# Patient Record
Sex: Female | Born: 1965 | Race: White | Hispanic: No | Marital: Married | Smoking: Never smoker
Health system: Southern US, Community
[De-identification: ages and names within clinical notes are randomized; demographics above are authoritative.]

## PROBLEM LIST (undated history)

## (undated) DIAGNOSIS — K219 Gastro-esophageal reflux disease without esophagitis: Secondary | ICD-10-CM

## (undated) HISTORY — DX: Gastro-esophageal reflux disease without esophagitis: K21.9

---

## 1993-07-28 HISTORY — PX: LAPAROSCOPIC CHOLECYSTECTOMY: SUR755

## 1998-04-09 ENCOUNTER — Other Ambulatory Visit: Admission: RE | Admit: 1998-04-09 | Discharge: 1998-04-09 | Payer: Self-pay | Admitting: Obstetrics and Gynecology

## 1999-06-06 ENCOUNTER — Other Ambulatory Visit: Admission: RE | Admit: 1999-06-06 | Discharge: 1999-06-06 | Payer: Self-pay | Admitting: Obstetrics and Gynecology

## 2000-06-26 ENCOUNTER — Other Ambulatory Visit: Admission: RE | Admit: 2000-06-26 | Discharge: 2000-06-26 | Payer: Self-pay | Admitting: Obstetrics and Gynecology

## 2001-07-26 ENCOUNTER — Other Ambulatory Visit: Admission: RE | Admit: 2001-07-26 | Discharge: 2001-07-26 | Payer: Self-pay | Admitting: Obstetrics and Gynecology

## 2002-11-17 ENCOUNTER — Other Ambulatory Visit: Admission: RE | Admit: 2002-11-17 | Discharge: 2002-11-17 | Payer: Self-pay | Admitting: Obstetrics and Gynecology

## 2003-12-26 ENCOUNTER — Other Ambulatory Visit: Admission: RE | Admit: 2003-12-26 | Discharge: 2003-12-26 | Payer: Self-pay | Admitting: Obstetrics and Gynecology

## 2005-03-14 ENCOUNTER — Other Ambulatory Visit: Admission: RE | Admit: 2005-03-14 | Discharge: 2005-03-14 | Payer: Self-pay | Admitting: Obstetrics and Gynecology

## 2007-05-14 ENCOUNTER — Encounter (INDEPENDENT_AMBULATORY_CARE_PROVIDER_SITE_OTHER): Payer: Self-pay | Admitting: Obstetrics and Gynecology

## 2007-05-14 ENCOUNTER — Ambulatory Visit (HOSPITAL_COMMUNITY): Admission: RE | Admit: 2007-05-14 | Discharge: 2007-05-14 | Payer: Self-pay | Admitting: Obstetrics and Gynecology

## 2008-07-28 HISTORY — PX: LASER ABLATION: SHX1947

## 2010-12-10 NOTE — Op Note (Signed)
NAMENATAJAH, DERDERIAN               ACCOUNT NO.:  [id number]   MEDICAL RECORD NO.:  [id number]          PATIENT TYPE:  AMB   LOCATION:  SDC                           FACILITY:  [facility]   PHYSICIAN:  Juluis Mire, M.D.   DATE OF BIRTH:  Nov 12, 1965   DATE OF PROCEDURE:  05/14/2007  DATE OF DISCHARGE:  05/14/2007                               OPERATIVE REPORT   PREOPERATIVE DIAGNOSIS:  Menorrhagia with endometrial polyp.   POSTOPERATIVE DIAGNOSIS:  Menorrhagia with endometrial polyp.   PROCEDURE:  Hysteroscopy, D and C, paracervical block, endometrial  ablation using a NovaSure.   SURGEON:  Juluis Mire, M.D.   ANESTHESIA:  General and endocervical block.   ESTIMATED BLOOD LOSS:  Minimal.   PACKS AND DRAINS:  None.   INTRAOPERATIVE BLOOD REPLACED:  None.   COMPLICATIONS:  None.   INDICATIONS:  Dictated in history and physical.   PROCEDURE IN DETAIL:  The patient was taken to the OR and placed in  supine position.  After a satisfactory level of general anesthesia was  obtained the patient was placed in the dorsal lithotomy position using  the Allen stirrups.  Perineum and vagina were prepped out with Betadine  and draped in a sterile field.  Speculum was placed in the vaginal  vault.  The cervix was grasped with a single tooth tenaculum.  Paracervical block was instituted using 1% Xylocaine with epinephrine.  Uterus sounded to 10 cm.  Endocervical length was 5 cm.  Cervix was  dilated.  Nonoperative hysteroscope was introduced and the intrauterine  cavity was distended using lactated Ringer's.  She had thickened  endometrium but we could not really delineate a distinct polyp therefore  we did extensive curettings and tissue was sent for pathological review.  After curettings we re-inserted the hysteroscope.  The lining was  basically had been removed at that point.  Now the NovaSure was put in  place and deployed.  Total cavity length was 5, total cavity length was  4.6.   Initially we did not get a good seal on the cervix and failed the  CO2 test.  We grasped the cervix again with a single tooth tenaculum and  we did pass the CO2 test the second time.  Ablation was undertaken for a  power of 127 for 1 minute and 50 seconds.  The NovaSure was removed  intact.  Re-hysteroscopy revealed all the endometrium to be adequately  ablated.  There were no signs of perforation.  Total deficit was 100  mL.  The single tooth tenaculum and speculum then removed.  The patient  was taken out of the dorsal lithotomy position.  Once alert and  extubated transferred to recovery room in good condition.  Sponge,  instrument and needle count was correct by circulating nurse x2.      Juluis Mire, M.D.  Electronically Signed     JSM/MEDQ  D:  05/14/2007  T:  05/16/2007  Job:  [id number]

## 2010-12-10 NOTE — H&P (Signed)
NAME:  Gloria Andrews, Gloria Andrews NO.:  [id number]   MEDICAL RECORD NO.:  [id number]          PATIENT TYPE:  AMB   LOCATION:  SDC                           FACILITY:  [facility]   PHYSICIAN:  Juluis Mire, M.D.   DATE OF BIRTH:  10-Oct-1965   DATE OF ADMISSION:  05/14/2007  DATE OF DISCHARGE:                              HISTORY & PHYSICAL   HISTORY:  The patient is a 45 year old gravida 2, para 2, abortus 1  female who presents for hysteroscopy and ablation.  The patient has had  a previous bilateral tubal ligation.  Cycles are regular.  She has heavy  flow for about 2 days.  She goes through pads and tampons every 30  minutes to 1 hour with clots and some dysmenorrhea.  She had a saline  infusion ultrasound that did reveal a small intrauterine polyp.  We  discussed removing the polyp.  She is interested in going ahead and  doing ablation at the same time.  Alternatives such as birth control  pills or Mirena IUD have been discussed.  Also discussed hysterectomy.  We admit her at the present time.  We are going to resect the polyp and  proceed with NovaSure ablation.   ALLERGIES:  In terms of allergies she has no known drug allergies.   MEDICATIONS:  None.   PAST MEDICAL HISTORY:  The usual childhood diseases.  No significant  sequelae.   PAST SURGICAL HISTORY:  She has had a laparoscopic bilateral tubal  ligation.  She also had a laparoscopic evaluation of ovarian cyst.  She  has had two vaginal deliveries and one miscarriage.   SOCIAL HISTORY:  No tobacco or alcohol use.   FAMILY HISTORY:  Noncontributory.   REVIEW OF SYSTEMS:  Noncontributory.   PHYSICAL EXAMINATION:  VITAL SIGNS:  The patient is afebrile, stable  vital signs.  HEENT: The patient is normocephalic.  Pupils equal, round and reactive  to light and accommodation.  Extraocular movements were intact.  Sclerae  and conjunctivae clear.  Oropharynx clear.  NECK:  Without thyromegaly.  BREASTS:  No discrete  masses.  LUNGS:  Clear.  CARDIOVASCULAR:  Regular rate without murmurs or gallops.  ABDOMEN:  Abdominal exam is benign.  No mass, organomegaly or  tenderness.  PELVIC:  Normal external genitalia.  Vaginal mucosa is clear.  Cervix  unremarkable.  Uterus normal size, shape and contour.  Adnexa free of  mass or tenderness.  EXTREMITIES:  Trace edema.  NEUROLOGICAL:  Grossly within normal limits.   IMPRESSION:  1. Menorrhagia.  2. Endometrial polyp.   PLAN:  The patient will undergo hysteroscopy, resection of polyps and  subsequent ablation.  Success rates for ablation of approximately 80%  are quoted with improvement period, 34% amenorrhea rate.  The risks were  explained including the risk of infection.  The risk of vascular injury  could lead to hemorrhage requiring transfusion with the risk of AIDS or  hepatitis.  The risk of injury to adjacent organs through perforation  that could include bowel, this could require further exploratory  surgery.  Risk of deep  venous thrombosis and pulmonary embolus.  The  patient expressed understanding of indications and risks.      Juluis Mire, M.D.  Electronically Signed     JSM/MEDQ  D:  05/14/2007  T:  05/15/2007  Job:  [id number]

## 2011-05-07 LAB — CBC
HCT: 42
Hemoglobin: 14.2
MCV: 93.8
Platelets: 212
RDW: 12.3

## 2012-03-14 ENCOUNTER — Encounter (HOSPITAL_COMMUNITY): Payer: Self-pay | Admitting: *Deleted

## 2012-03-14 ENCOUNTER — Emergency Department (HOSPITAL_COMMUNITY)
Admission: EM | Admit: 2012-03-14 | Discharge: 2012-03-14 | Disposition: A | Discharge status: Home / Self Care | Payer: PRIVATE HEALTH INSURANCE | Attending: Emergency Medicine | Admitting: Emergency Medicine

## 2012-03-14 DIAGNOSIS — R197 Diarrhea, unspecified: Secondary | ICD-10-CM | POA: Insufficient documentation

## 2012-03-14 DIAGNOSIS — E876 Hypokalemia: Secondary | ICD-10-CM | POA: Insufficient documentation

## 2012-03-14 LAB — COMPREHENSIVE METABOLIC PANEL
AST: 25 U/L (ref 0–37)
Albumin: 3.5 g/dL (ref 3.5–5.2)
Alkaline Phosphatase: 120 U/L — ABNORMAL HIGH (ref 39–117)
Chloride: 102 mEq/L (ref 96–112)
Creatinine, Ser: 0.73 mg/dL (ref 0.50–1.10)
Potassium: 3.4 mEq/L — ABNORMAL LOW (ref 3.5–5.1)
Total Bilirubin: 0.2 mg/dL — ABNORMAL LOW (ref 0.3–1.2)
Total Protein: 6.6 g/dL (ref 6.0–8.3)

## 2012-03-14 LAB — CBC WITH DIFFERENTIAL/PLATELET
Basophils Absolute: 0.1 10*3/uL (ref 0.0–0.1)
Eosinophils Relative: 1 % (ref 0–5)
Monocytes Relative: 9 % (ref 3–12)
Neutrophils Relative %: 72 % (ref 43–77)
Platelets: 180 10*3/uL (ref 150–400)
RBC: 4.7 MIL/uL (ref 3.87–5.11)
WBC: 9.2 10*3/uL (ref 4.0–10.5)

## 2012-03-14 MED ORDER — SODIUM CHLORIDE 0.9 % IV SOLN
1000.0000 mL | Freq: Once | INTRAVENOUS | Status: AC
  Administered 2012-03-14: 1000 mL via INTRAVENOUS

## 2012-03-14 MED ORDER — SODIUM CHLORIDE 0.9 % IV SOLN: 1000.0000 mL | INTRAVENOUS | Status: DC

## 2012-03-14 MED ORDER — PROMETHAZINE HCL 25 MG PO TABS: 25.0000 mg | ORAL_TABLET | Freq: Four times a day (QID) | ORAL | Status: DC | PRN

## 2012-03-14 MED ORDER — PROMETHAZINE HCL 25 MG/ML IJ SOLN
25.0000 mg | Freq: Once | INTRAMUSCULAR | Status: AC
  Administered 2012-03-14: 25 mg via INTRAVENOUS
  Filled 2012-03-14: qty 1

## 2012-03-14 NOTE — ED Notes (Signed)
Pt c/o diarrhea since Wednesday morning. Pt seen by MD on Friday and given Lomotil and it has not helped.

## 2012-03-14 NOTE — ED Provider Notes (Signed)
History     CSN: [id number]  Arrival date & time 03/14/12  1322   First MD Initiated Contact with Patient 03/14/12 1331      Chief Complaint  Patient presents with  . Diarrhea    (Consider location/radiation/quality/duration/timing/severity/associated sxs/prior treatment) HPI  Patient reports they ate out Wednesday, 4 days ago and she had broiled fish, normally she gets it fried, but is trying to watch her weight. She also had salad. She reports within 20 minutes she started having diarrhea which has persisited. She reports over 10 episodes a day or loose and watery diarrhea, sometimes a small amount, sometimes a lot. No nausea until just prior to coming to the ED. No abdominal pain until yesterday after she saw Dr Phillips Odor and was started on lomotil and she then had mild cramping in her lower abdomen that got alittle worse today. Husband states she may have had fever the first night but they did not check her temperature. Denies being thirsty, lightheaded, dizzy, or having any difficulty walking. She denies being on any antibiotics in the past months. Husband reports she is passing a lot of gas today. Of importance nobody else who ate the same foods she did is ill.  PCP Dr. Phillips Odor  History reviewed. No pertinent past medical history.  Past Surgical History  Procedure Date  . Cholecystectomy   . Laser ablation     uterus    History reviewed. No pertinent family history.  History  Substance Use Topics  . Smoking status: Never Smoker   . Smokeless tobacco: Not on file  . Alcohol Use: No  lives with spouse employed  OB History    Grav Para Term Preterm Abortions TAB SAB Ect Mult Living                  Review of Systems  All other systems reviewed and are negative.    Allergies  Review of patient's allergies indicates no known allergies.  Home Medications   Current Outpatient Rx  Name Route Sig Dispense Refill  . PROMETHAZINE HCL 25 MG PO TABS Oral Take 1  tablet (25 mg total) by mouth every 6 (six) hours as needed for nausea (or abdominal cramping). 8 tablet 0    BP 137/69  Pulse 79  Temp 98.5 F (36.9 C) (Oral)  Resp 18  Ht 5\' 8"  (1.727 m)  Wt 160 lb (72.576 kg)  BMI 24.33 kg/m2  SpO2 97%  Vital signs normal    Physical Exam  Nursing note and vitals reviewed. Constitutional: She is oriented to person, place, and time. She appears well-developed and well-nourished.  Non-toxic appearance. She does not appear ill. No distress.  HENT:  Head: Normocephalic and atraumatic.  Right Ear: External ear normal.  Left Ear: External ear normal.  Nose: Nose normal. No mucosal edema or rhinorrhea.  Mouth/Throat: Mucous membranes are normal. No dental abscesses or uvula swelling.       Tongue dry  Eyes: Conjunctivae and EOM are normal. Pupils are equal, round, and reactive to light.  Neck: Normal range of motion and full passive range of motion without pain. Neck supple.  Cardiovascular: Normal rate, regular rhythm and normal heart sounds.  Exam reveals no gallop and no friction rub.   No murmur heard. Pulmonary/Chest: Effort normal and breath sounds normal. No respiratory distress. She has no wheezes. She has no rhonchi. She has no rales. She exhibits no tenderness and no crepitus.  Abdominal: Soft. Normal appearance and bowel sounds are  normal. She exhibits no distension. There is no tenderness. There is no rebound and no guarding.  Musculoskeletal: Normal range of motion. She exhibits no edema and no tenderness.       Moves all extremities well.   Neurological: She is alert and oriented to person, place, and time. She has normal strength. No cranial nerve deficit.  Skin: Skin is warm, dry and intact. No rash noted. No erythema. No pallor.  Psychiatric: She has a normal mood and affect. Her speech is normal and behavior is normal. Her mood appears not anxious.    ED Course  Procedures (including critical care time)   Medications  0.9 %   sodium chloride infusion (1000 mL Intravenous New Bag/Given 03/14/12 1432)    Followed by  0.9 %  sodium chloride infusion (1000 mL Intravenous New Bag/Given 03/14/12 1527)    Followed by  0.9 %  sodium chloride infusion (not administered)  promethazine (PHENERGAN) injection 25 mg (25 mg Intravenous Given 03/14/12 1432)   16:00 recheck, has had 1500 cc of NS, is having urine output. No diarrhea while in the ED.  Have discussed aftercare.    Results for orders placed during the hospital encounter of 03/14/12  CBC WITH DIFFERENTIAL      Component Value Range   WBC 9.2  4.0 - 10.5 K/uL   RBC 4.70  3.87 - 5.11 MIL/uL   Hemoglobin 15.0  12.0 - 15.0 g/dL   HCT 16.1  09.6 - 04.5 %   MCV 93.4  78.0 - 100.0 fL   MCH 31.9  26.0 - 34.0 pg   MCHC 34.2  30.0 - 36.0 g/dL   RDW 40.9  81.1 - 91.4 %   Platelets 180  150 - 400 K/uL   Neutrophils Relative 72  43 - 77 %   Lymphocytes Relative 17  12 - 46 %   Monocytes Relative 9  3 - 12 %   Eosinophils Relative 1  0 - 5 %   Basophils Relative 1  0 - 1 %   Neutro Abs 6.6  1.7 - 7.7 K/uL   Lymphs Abs 1.6  0.7 - 4.0 K/uL   Monocytes Absolute 0.8  0.1 - 1.0 K/uL   Eosinophils Absolute 0.1  0.0 - 0.7 K/uL   Basophils Absolute 0.1  0.0 - 0.1 K/uL   WBC Morphology MILD LEFT SHIFT (1-5% METAS, OCC MYELO, OCC BANDS)     Smear Review LARGE PLATELETS PRESENT    COMPREHENSIVE METABOLIC PANEL      Component Value Range   Sodium 138  135 - 145 mEq/L   Potassium 3.4 (*) 3.5 - 5.1 mEq/L   Chloride 102  96 - 112 mEq/L   CO2 27  19 - 32 mEq/L   Glucose, Bld 108 (*) 70 - 99 mg/dL   BUN 8  6 - 23 mg/dL   Creatinine, Ser 7.82  0.50 - 1.10 mg/dL   Calcium 9.5  8.4 - 95.6 mg/dL   Total Protein 6.6  6.0 - 8.3 g/dL   Albumin 3.5  3.5 - 5.2 g/dL   AST 25  0 - 37 U/L   ALT 71 (*) 0 - 35 U/L   Alkaline Phosphatase 120 (*) 39 - 117 U/L   Total Bilirubin 0.2 (*) 0.3 - 1.2 mg/dL   GFR calc non Af Amer >90  >90 mL/min   GFR calc Af Amer >90  >90 mL/min   Laboratory  interpretation all normal except Mild hypo-kalemia  1. Hypokalemia   2. Diarrhea    New Prescriptions   PROMETHAZINE (PHENERGAN) 25 MG TABLET    Take 1 tablet (25 mg total) by mouth every 6 (six) hours as needed for nausea (or abdominal cramping).    Plan discharge  Devoria Albe, MD, FACEP    MDM          Ward Givens, MD 03/14/12 [phone]

## 2012-03-14 NOTE — ED Notes (Signed)
Pt reporting improvement.  Denies nausea at present time.  No loose stools since arriving at department.  Pt denies needs at present time.

## 2012-03-14 NOTE — Progress Notes (Signed)
5:49 PM Pt feeling better, ready to go home.  I reviewed her lab results with her.  Released with Rx for Phenergan as per Dr. Lynelle Doctor.

## 2014-02-06 ENCOUNTER — Other Ambulatory Visit (HOSPITAL_COMMUNITY): Payer: Self-pay | Admitting: Family Medicine

## 2014-02-06 ENCOUNTER — Ambulatory Visit (HOSPITAL_COMMUNITY)
Admission: RE | Admit: 2014-02-06 | Discharge: 2014-02-06 | Disposition: A | Discharge status: Home / Self Care | Payer: PRIVATE HEALTH INSURANCE | Source: Ambulatory Visit | Attending: Family Medicine | Admitting: Family Medicine

## 2014-02-06 DIAGNOSIS — M25532 Pain in left wrist: Secondary | ICD-10-CM

## 2014-02-06 DIAGNOSIS — M79609 Pain in unspecified limb: Secondary | ICD-10-CM | POA: Insufficient documentation

## 2014-07-18 ENCOUNTER — Other Ambulatory Visit: Payer: Self-pay | Admitting: Obstetrics and Gynecology

## 2014-07-19 LAB — CYTOLOGY - PAP

## 2015-08-10 ENCOUNTER — Encounter: Payer: Self-pay | Admitting: Gastroenterology

## 2015-09-19 ENCOUNTER — Ambulatory Visit (AMBULATORY_SURGERY_CENTER): Payer: Self-pay | Admitting: *Deleted

## 2015-09-19 VITALS — Ht 68.0 in | Wt 162.6 lb

## 2015-09-19 DIAGNOSIS — Z1211 Encounter for screening for malignant neoplasm of colon: Secondary | ICD-10-CM

## 2015-09-19 MED ORDER — NA SULFATE-K SULFATE-MG SULF 17.5-3.13-1.6 GM/177ML PO SOLN: ORAL | Status: DC

## 2015-09-19 NOTE — Progress Notes (Signed)
No allergies to eggs or soy. No problems with anesthesia.  Pt given Emmi instructions for colonoscopy  No oxygen use  No diet drug use  

## 2015-10-03 ENCOUNTER — Encounter: Payer: PRIVATE HEALTH INSURANCE | Admitting: Gastroenterology

## 2015-10-05 ENCOUNTER — Encounter: Payer: Self-pay | Admitting: Gastroenterology

## 2015-10-05 ENCOUNTER — Ambulatory Visit (AMBULATORY_SURGERY_CENTER): Payer: PRIVATE HEALTH INSURANCE | Admitting: Gastroenterology

## 2015-10-05 VITALS — BP 132/63 | HR 66 | Temp 98.6°F | Resp 10 | Ht 68.0 in | Wt 162.0 lb

## 2015-10-05 DIAGNOSIS — K621 Rectal polyp: Secondary | ICD-10-CM | POA: Diagnosis not present

## 2015-10-05 DIAGNOSIS — Z1211 Encounter for screening for malignant neoplasm of colon: Secondary | ICD-10-CM | POA: Diagnosis present

## 2015-10-05 MED ORDER — SODIUM CHLORIDE 0.9 % IV SOLN: 500.0000 mL | INTRAVENOUS | Status: DC

## 2015-10-05 NOTE — Op Note (Signed)
[facility] Patient Name: Gloria Andrews Procedure Date: 10/05/2015 7:40 AM MRN: [id number] Endoscopist: Napoleon Form , MD Age: 50 Referring MD:  Date of Birth: 10/02/1965 Gender: Female Procedure:                Colonoscopy Indications:              Screening for colorectal malignant neoplasm Medicines:                Propofol total dose 310 mg IV, Monitored Anesthesia                            Care Procedure:                Pre-Anesthesia Assessment:                           - Prior to the procedure, a History and Physical                            was performed, and patient medications and                            allergies were reviewed. The patient's tolerance of                            previous anesthesia was also reviewed. The risks                            and benefits of the procedure and the sedation                            options and risks were discussed with the patient.                            All questions were answered, and informed consent                            was obtained. Prior Anticoagulants: The patient has                            taken no previous anticoagulant or antiplatelet                            agents. ASA Grade Assessment: I - A normal, healthy                            patient. After reviewing the risks and benefits,                            the patient was deemed in satisfactory condition to                            undergo the procedure.  After obtaining informed consent, the colonoscope                            was passed under direct vision. Throughout the                            procedure, the patient's blood pressure, pulse, and                            oxygen saturations were monitored continuously. The                            Model PCF-H190L [id number]) scope was introduced                            through the anus and advanced to the the cecum,             identified by appendiceal orifice and ileocecal                            valve. The colonoscopy was performed with ease. The                            patient tolerated the procedure well. The quality                            of the bowel preparation was good. The ileocecal                            valve, appendiceal orifice, and rectum were                            photographed. Scope In: 8:09:50 AM Scope Out: 8:26:16 AM Scope Withdrawal Time: 0 hours 11 minutes 18 seconds  Total Procedure Duration: 0 hours 16 minutes 26 seconds  Findings:      The perianal and digital rectal examinations were normal.      A 5 mm polyp was found in the rectum. The polyp was sessile. The polyp       was removed with a cold snare. Resection and retrieval were complete.      Non-bleeding internal hemorrhoids were found during retroflexion. The       hemorrhoids were small. Complications:            No immediate complications. Estimated Blood Loss:     Estimated blood loss: none. Impression:               - One 5 mm polyp in the rectum, removed with a cold                            snare. Resected and retrieved.                           - Non-bleeding internal hemorrhoids. Recommendation:           - Patient has a contact number available for  emergencies. The signs and symptoms of potential                            delayed complications were discussed with the                            patient. Return to normal activities tomorrow.                            Written discharge instructions were provided to the                            patient.                           - Resume previous diet.                           - Continue present medications.                           - Await pathology results.                           - Repeat colonoscopy in 5-10 years for surveillance                            based on pathology results. Procedure Code(s):         --- Professional ---                           [phone], Colonoscopy, flexible; with removal of                            tumor(s), polyp(s), or other lesion(s) by snare                            technique CPT copyright 2016 [employer]. All rights reserved. Napoleon FormKavitha V. Nandigam, MD 10/05/2015 8:31:53 AM This report has been signed electronically. Number of Addenda: 0

## 2015-10-05 NOTE — Patient Instructions (Signed)
YOU HAD AN ENDOSCOPIC PROCEDURE TODAY AT THE [facility]:   Refer to the procedure report that was given to you for any specific questions about what was found during the examination.  If the procedure report does not answer your questions, please call your gastroenterologist to clarify.  If you requested that your care partner not be given the details of your procedure findings, then the procedure report has been included in a sealed envelope for you to review at your convenience later.  YOU SHOULD EXPECT: Some feelings of bloating in the abdomen. Passage of more gas than usual.  Walking can help get rid of the air that was put into your GI tract during the procedure and reduce the bloating. If you had a lower endoscopy (such as a colonoscopy or flexible sigmoidoscopy) you may notice spotting of blood in your stool or on the toilet paper. If you underwent a bowel prep for your procedure, you may not have a normal bowel movement for a few days.  Please Note:  You might notice some irritation and congestion in your nose or some drainage.  This is from the oxygen used during your procedure.  There is no need for concern and it should clear up in a day or so.  SYMPTOMS TO REPORT IMMEDIATELY:   Following lower endoscopy (colonoscopy or flexible sigmoidoscopy):  Excessive amounts of blood in the stool  Significant tenderness or worsening of abdominal pains  Swelling of the abdomen that is new, acute  Fever of 100F or higher   For urgent or emergent issues, a gastroenterologist can be reached at any hour by calling [phone].   DIET: Your first meal following the procedure should be a small meal and then it is ok to progress to your normal diet. Heavy or fried foods are harder to digest and may make you feel nauseous or bloated.  Likewise, meals heavy in dairy and vegetables can increase bloating.  Drink plenty of fluids but you should avoid alcoholic beverages for 24  hours.  ACTIVITY:  You should plan to take it easy for the rest of today and you should NOT DRIVE or use heavy machinery until tomorrow (because of the sedation medicines used during the test).    FOLLOW UP: Our staff will call the number listed on your records the next business day following your procedure to check on you and address any questions or concerns that you may have regarding the information given to you following your procedure. If we do not reach you, we will leave a message.  However, if you are feeling well and you are not experiencing any problems, there is no need to return our call.  We will assume that you have returned to your regular daily activities without incident.  If any biopsies were taken you will be contacted by phone or by letter within the next 1-3 weeks.  Please call us at [phone] if you have not heard about the biopsies in 3 weeks.    SIGNATURES/CONFIDENTIALITY: You and/or your care partner have signed paperwork which will be entered into your electronic medical record.  These signatures attest to the fact that that the information above on your After Visit Summary has been reviewed and is understood.  Full responsibility of the confidentiality of this discharge information lies with you and/or your care-partner.  Polyp and hemmorrhoid inforation given.

## 2015-10-05 NOTE — Progress Notes (Signed)
A/ox3, pleased with MAC, report to RN 

## 2015-10-05 NOTE — Progress Notes (Signed)
Called to room to assist during endoscopic procedure.  Patient ID and intended procedure confirmed with present staff. Received instructions for my participation in the procedure from the performing physician.  

## 2015-10-08 ENCOUNTER — Telehealth: Payer: Self-pay | Admitting: *Deleted

## 2015-10-08 NOTE — Telephone Encounter (Signed)
  Follow up Call-  Call back number 10/05/2015  Post procedure Call Back phone  # [phone]  Permission to leave phone message Yes     Patient questions:  Do you have a fever, pain , or abdominal swelling? No. Pain Score  0 *  Have you tolerated food without any problems? Yes.    Have you been able to return to your normal activities? Yes.    Do you have any questions about your discharge instructions: Diet   No. Medications  No. Follow up visit  No.  Do you have questions or concerns about your Care? No.  Actions: * If pain score is 4 or above: No action needed, pain <4.

## 2015-10-10 ENCOUNTER — Encounter: Payer: Self-pay | Admitting: Gastroenterology

## 2016-08-18 ENCOUNTER — Other Ambulatory Visit: Payer: Self-pay | Admitting: Obstetrics and Gynecology

## 2016-08-18 DIAGNOSIS — R928 Other abnormal and inconclusive findings on diagnostic imaging of breast: Secondary | ICD-10-CM

## 2016-08-20 ENCOUNTER — Ambulatory Visit
Admission: RE | Admit: 2016-08-20 | Discharge: 2016-08-20 | Disposition: A | Discharge status: Home / Self Care | Payer: PRIVATE HEALTH INSURANCE | Source: Ambulatory Visit | Attending: Obstetrics and Gynecology | Admitting: Obstetrics and Gynecology

## 2016-08-20 DIAGNOSIS — R928 Other abnormal and inconclusive findings on diagnostic imaging of breast: Secondary | ICD-10-CM

## 2016-10-21 ENCOUNTER — Ambulatory Visit (HOSPITAL_COMMUNITY): Payer: PRIVATE HEALTH INSURANCE | Attending: Sports Medicine | Admitting: Specialist

## 2016-10-21 DIAGNOSIS — M25511 Pain in right shoulder: Secondary | ICD-10-CM | POA: Diagnosis not present

## 2016-10-21 DIAGNOSIS — M25611 Stiffness of right shoulder, not elsewhere classified: Secondary | ICD-10-CM | POA: Insufficient documentation

## 2016-10-21 NOTE — Patient Instructions (Addendum)
Complete each stretch 2-3 times each, 2-3 times per day.  Hold each stretch 10-15 seconds.   Posterior Capsule Sleeper Stretch, Side-Lying    Lie on side, pillow under head, neck in neutral, underside arm in 90-90 of shoulder and elbow flexion with scapula fixed to table. Use other hand to press back of underside arm forward and downward. Keep elbow angle. Hold _10-15__ seconds.  Repeat _2-3__ times per session. Do _2_ sessions per day. Advanced: Use PNF contract-relax method.  Copyright  VHI. All rights reserved.   Doorway Stretch  Place each hand opposite each other on the doorway. (You can change where you feel the stretch by moving arms higher or lower.) Step through with one foot and bend front knee until a stretch is felt and hold. Step through with the opposite foot on the next rep. Hold for _____ seconds. Repeat ____times.     Scapular Retraction (Standing)   With arms at sides, pinch shoulder blades together. Repeat ____ times per set. Do ____ sets per session. Do ____ sessions per day.  http://orth.exer.us/944   Copyright  VHI. All rights reserved.   Internal Rotation Across Back  Grab the end of a towel with your affected side, palm facing backwards. Grab the towel with your unaffected side and pull your affected hand across your back until you feel a stretch in the front of your shoulder. If you feel pain, pull just to the pain, do not pull through the pain. Hold. Return your affected arm to your side. Try to keep your hand/arm close to your body during the entire movement.            Posterior Capsule Stretch   Stand or sit, one arm across body so hand rests over opposite shoulder. Gently push on crossed elbow with other hand until stretch is felt in shoulder of crossed arm. Hold ___ seconds.  Repeat ___ times per session. Do ___ sessions per day.   Wall Flexion  Using a towel, slide your arm up the wall until a stretch is felt in your shoulder  .   Shoulder Internal Rotation    Standing, feet shoulder width apart, grasp club with one hand palm forward, arm extended above head, and other hand palm back behind back, arm bent elbow down. Pull gently upward. Hold ____ seconds. Switch arms and repeat. Repeat ____ times. Do ____ sessions per day.  Copyright  VHI. All rights reserved.

## 2016-10-21 NOTE — Therapy (Signed)
[facility] [address] [place], [place], [address] Phone: [phone]   Fax:  [phone]  Occupational Therapy Evaluation  Patient Details  Name: Gloria Andrews MRN: [id number] Date of Birth: 04-03-1966 Referring Provider: Dr. Rodolph Bong  Encounter Date: 10/21/2016      OT End of Session - 10/21/16 1623    Visit Number 1   Number of Visits 8   Date for OT Re-Evaluation 11/20/16   Authorization Type [employer]   Authorization Time Period 12 weeks   Authorization - Visit Number 1   Authorization - Number of Visits 24   OT Start Time 1515   OT Stop Time 1557   OT Time Calculation (min) 42 min   Activity Tolerance Patient tolerated treatment well   Behavior During Therapy [facility] for tasks assessed/performed      Past Medical History:  Diagnosis Date  . GERD (gastroesophageal reflux disease)     Past Surgical History:  Procedure Laterality Date  . LAPAROSCOPIC CHOLECYSTECTOMY  1995  . LASER ABLATION  2010   uterus    There were no vitals filed for this visit.      Subjective Assessment - 10/21/16 1607    Subjective  S: My shoulder has hurt for at least 6 months.   Pertinent History Gloria Andrews has been experiencing increased pain and decreased range in her right shoulder for more than a year.  However, for the last 6 months the pain was so bad that she consulted with Dr. Penni Bombard.  An xray was completed and negative.  Gloria Andrews was given a cortisone injection, which has alleviated a majority of her pain.  She continues to experience decreased range and minimal pain.  Patient has been referred to occupational therapy for evaluation and treatment.    Special Tests FOTO:  72/100   Patient Stated Goals I want the pain to go away.   Currently in Pain? Yes   Pain Score 2    Pain Location Shoulder   Pain Orientation Right   Pain Descriptors / Indicators Aching   Pain Type Acute pain   Pain Onset Other (comment)  6 months or  more   Pain Frequency Intermittent   Aggravating Factors  reaching behind back   Pain Relieving Factors heat   Effect of Pain on Daily Activities minimal - works through the pain           [facility] OT Assessment - 10/21/16 0001      Assessment   Diagnosis Right Shoulder Pain   Referring Provider Dr. Rodolph Bong   Onset Date --  more than 6 months ago   Prior Therapy none     Precautions   Precautions None     Restrictions   Weight Bearing Restrictions No     Balance Screen   Has the patient fallen in the past 6 months No   Has the patient had a decrease in activity level because of a fear of falling?  No   Is the patient reluctant to leave their home because of a fear of falling?  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  majority of time is spent on computer or phone   Leisure refinishing furniture     ADL   ADL  comments painful when sanding furniture or reaching behind her back      Written Expression   Dominant Hand Left     Vision - History   Baseline Vision No visual deficits     Cognition   Overall Cognitive Status Within Functional Limits for tasks assessed     Observation/Other Assessments   Focus on Therapeutic Outcomes (FOTO)  72/100     Sensation   Light Touch Appears Intact     Coordination   Gross Motor Movements are Fluid and Coordinated Yes   Fine Motor Movements are Fluid and Coordinated Yes     ROM / Strength   AROM / PROM / Strength AROM;PROM;Strength     Palpation   Palpation comment minimal fascial restriction in right upper arm and scapular region     AROM   Overall AROM Comments assessed in seated, external rotation and internal rotation with shoulder abducted to 90   AROM Assessment Site Shoulder   Right/Left Shoulder Right   Right Shoulder Flexion 155  Degrees   Right Shoulder ABduction 145 Degrees   Right Shoulder Internal Rotation 30 Degrees   Right Shoulder External Rotation 75 Degrees     PROM   Overall PROM Comments assessed in supine and WNL     Strength   Overall Strength Comments assessed in seated, external and internal rotation with shoulder abducted to 90   Strength Assessment Site Shoulder   Right/Left Shoulder Right   Right Shoulder Flexion 5/5   Right Shoulder ABduction 5/5   Right Shoulder Internal Rotation 5/5   Right Shoulder External Rotation 5/5                  OT Treatments/Exercises (OP) - 10/21/16 0001      Exercises   Exercises Shoulder     Shoulder Exercises: Supine   Horizontal ABduction PROM;5 reps   External Rotation PROM;5 reps   Internal Rotation PROM;5 reps   Flexion PROM;5 reps   ABduction PROM;5 reps     Shoulder Exercises: Stretch   Cross Chest Stretch 1 rep;10 seconds   External Rotation Stretch 1 rep;10 seconds   Wall Stretch - Flexion 1 rep;10 seconds   Other Shoulder Stretches sleeper stretch right shoulder 1 repetition   Other Shoulder Stretches retraction 15 seconds 1 repetition     Manual Therapy   Manual Therapy Myofascial release   Manual therapy comments manual therapy completed seperately from all other interventions this date of service   Myofascial Release myofascial release and manual stretching to right upper arm, scapular, and shoulder region to decrease pain and improve pain free mobility               OT Education - 10/21/16 1621    Education provided Yes   Education Details educated patient on HEP for shoulder stretches   Person(s) Educated Patient   Methods Explanation;Demonstration;Handout   Comprehension Verbalized understanding;Returned demonstration          OT Short Term Goals - 10/21/16 1626      OT SHORT TERM GOAL #1   Title Patient will be educated and independent with a HEP for improved shoulder range.   Time 4   Period Weeks    Status New     OT SHORT TERM GOAL #2   Title Patient will have WNL A/ROM in her right shoulder in order to reach behind her back to fasten her bra.   Time 4   Period Weeks   Status  New     OT SHORT TERM GOAL #3   Title Patient will decrease pain in right shoulder to 1/10 or less when sanding furniture.   Time 4   Period Weeks   Status New     OT SHORT TERM GOAL #4   Title Patient will decrease fascial restrictions to trace in her right shoulder region for improved pain free range of motion needed to reach behind her back.   Time 4   Period Weeks   Status New                  Plan - 10/21/16 1624    Rehab Potential Excellent   OT Frequency 2x / week   OT Duration 4 weeks   OT Treatment/Interventions Self-care/ADL training;Cryotherapy;Electrical Stimulation;Moist Heat;Ultrasound;Therapeutic exercise;Neuromuscular education;Energy conservation;DME and/or AE instruction;Passive range of motion;Manual Therapy;Therapeutic exercises;Therapeutic activities;Patient/family education   Plan P:  Skilled OT intervention to improve range of motion while decreasing pain and fascial restrictions in right shoulder region in order to return to full use of right arm with functional activities.     OT Home Exercise Plan 10/21/16:  shoulder stretches   Consulted and Agree with Plan of Care Patient      Patient will benefit from skilled therapeutic intervention in order to improve the following deficits and impairments:  Decreased range of motion, Increased fascial restricitons, Impaired UE functional use, Increased muscle spasms, Impaired flexibility, Pain  Visit Diagnosis: Acute pain of right shoulder - Plan: Ot plan of care cert/re-cert  Stiffness of right shoulder, not elsewhere classified - Plan: Ot plan of care cert/re-cert    Problem List There are no active problems to display for this patient.   Shirlean MylarBethany H. Benetta Maclaren, MHA, OTR/L [phone]  10/21/2016, 4:41 PM  [facility] [address] [place], [place], [address] Phone: [phone]   Fax:  [phone]  Name: Gloria Andrews MRN: [id number] Date of Birth: 06/14/1966

## 2016-10-28 ENCOUNTER — Encounter (HOSPITAL_COMMUNITY): Payer: Self-pay

## 2016-10-28 ENCOUNTER — Ambulatory Visit (HOSPITAL_COMMUNITY): Payer: PRIVATE HEALTH INSURANCE | Attending: Sports Medicine

## 2016-10-28 DIAGNOSIS — M25511 Pain in right shoulder: Secondary | ICD-10-CM | POA: Insufficient documentation

## 2016-10-28 DIAGNOSIS — M25611 Stiffness of right shoulder, not elsewhere classified: Secondary | ICD-10-CM | POA: Insufficient documentation

## 2016-10-28 NOTE — Therapy (Signed)
[facility] [address] [place], [place], [address] Phone: [phone]   Fax:  [phone]  Occupational Therapy Treatment  Patient Details  Name: Gloria Andrews MRN: [id number] Date of Birth: [birth year] Referring Provider: Dr. Rodolph Bong  Encounter Date: 10/28/2016      OT End of Session - 10/28/16 1344    Visit Number 2   Number of Visits 8   Date for OT Re-Evaluation 11/20/16   Authorization Type [employer]   Authorization Time Period 12 weeks   Authorization - Visit Number 2   Authorization - Number of Visits 24   OT Start Time 1305   OT Stop Time 1345   OT Time Calculation (min) 40 min   Activity Tolerance Patient tolerated treatment well   Behavior During Therapy [facility] for tasks assessed/performed      Past Medical History:  Diagnosis Date  . GERD (gastroesophageal reflux disease)     Past Surgical History:  Procedure Laterality Date  . LAPAROSCOPIC CHOLECYSTECTOMY  1995  . LASER ABLATION  2010   uterus    There were no vitals filed for this visit.      Subjective Assessment - 10/28/16 1322    Subjective  S: I was able to key some with it at work and give me too much of a fit.   Currently in Pain? No/denies                      OT Treatments/Exercises (OP) - 10/28/16 1330      Exercises   Exercises Shoulder     Shoulder Exercises: Supine   Protraction PROM;5 reps   Horizontal ABduction PROM;5 reps;Strengthening;12 reps   Horizontal ABduction Weight (lbs) 2   External Rotation PROM;5 reps;AROM;12 reps  abduction   Internal Rotation PROM;5 reps;AROM;12 reps  abduction   Flexion PROM;5 reps;Strengthening;12 reps   Shoulder Flexion Weight (lbs) 2   ABduction PROM;5 reps;Strengthening;12 reps   Shoulder ABduction Weight (lbs) 1   Other Supine Exercises serratus anterior punch; 12X; 2#     Shoulder Exercises: Prone   Flexion Strengthening;12 reps   Flexion Weight (lbs) 2   Horizontal  ABduction 1 Strengthening;12 reps   Horizontal ABduction 1 Weight (lbs) 2   Horizontal ABduction 2 Strengthening;12 reps   Horizontal ABduction 2 Weight (lbs) 2   Other Prone Exercises protraction; 2#; 12X   Other Prone Exercises Scaption; 2#; 12X     Shoulder Exercises: Sidelying   Flexion Strengthening;12 reps   Flexion Weight (lbs) 2   ABduction Strengthening;12 reps   ABduction Weight (lbs) 2     Shoulder Exercises: ROM/Strengthening   UBE (Upper Arm Bike) Level 2 3' reverse     Manual Therapy   Manual Therapy Myofascial release   Manual therapy comments manual therapy completed seperately from all other interventions this date of service                  OT Short Term Goals - 10/28/16 1324      OT SHORT TERM GOAL #1   Title Patient will be educated and independent with a HEP for improved shoulder range.   Time 4   Period Weeks   Status On-going     OT SHORT TERM GOAL #2   Title Patient will have WNL A/ROM in her right shoulder in order to reach behind her back to fasten her bra.   Time 4   Period Weeks   Status On-going  OT SHORT TERM GOAL #3   Title Patient will decrease pain in right shoulder to 1/10 or less when sanding furniture.   Time 4   Period Weeks   Status On-going     OT SHORT TERM GOAL #4   Title Patient will decrease fascial restrictions to trace in her right shoulder region for improved pain free range of motion needed to reach behind her back.   Time 4   Period Weeks   Status On-going                  Plan - 10/28/16 1345    Clinical Impression Statement A: Initiated myofascial release, manual therapy, and focused scapular and trapezius stability during session in supine, prone, and sidelying. Verbal cues for form and technique.   Plan P: Add ball on the wall.  Test trapezius strength prone.       Patient will benefit from skilled therapeutic intervention in order to improve the following deficits and impairments:   Decreased range of motion, Increased fascial restricitons, Impaired UE functional use, Increased muscle spasms, Impaired flexibility, Pain  Visit Diagnosis: Acute pain of right shoulder  Stiffness of right shoulder, not elsewhere classified    Problem List There are no active problems to display for this patient.  Limmie Patricia, OTR/L,CBIS  [phone]  10/28/2016, 1:56 PM  [facility] [address] [place], [place], [address] Phone: [phone]   Fax:  [phone]  Name: Gloria Andrews MRN: [id number] Date of Birth: 02-07-1966

## 2016-10-31 ENCOUNTER — Ambulatory Visit (HOSPITAL_COMMUNITY): Payer: PRIVATE HEALTH INSURANCE

## 2016-10-31 ENCOUNTER — Encounter (HOSPITAL_COMMUNITY): Payer: Self-pay

## 2016-10-31 DIAGNOSIS — M25611 Stiffness of right shoulder, not elsewhere classified: Secondary | ICD-10-CM

## 2016-10-31 DIAGNOSIS — M25511 Pain in right shoulder: Secondary | ICD-10-CM | POA: Diagnosis not present

## 2016-10-31 NOTE — Therapy (Signed)
[facility] [address] [place], [place], [address] Phone: [phone]   Fax:  [phone]  Occupational Therapy Treatment  Patient Details  Name: Gloria Andrews MRN: [id number] Date of Birth: Aug 15, 1965 Referring Provider: Dr. Rodolph Bong  Encounter Date: 10/31/2016      OT End of Session - 10/31/16 1119    Visit Number 3   Number of Visits 8   Date for OT Re-Evaluation 11/20/16   Authorization Type [employer]   Authorization Time Period 12 weeks   Authorization - Visit Number 3   Authorization - Number of Visits 24   OT Start Time 1037   OT Stop Time 1115   OT Time Calculation (min) 38 min   Activity Tolerance Patient tolerated treatment well   Behavior During Therapy [facility] for tasks assessed/performed      Past Medical History:  Diagnosis Date  . GERD (gastroesophageal reflux disease)     Past Surgical History:  Procedure Laterality Date  . LAPAROSCOPIC CHOLECYSTECTOMY  1995  . LASER ABLATION  2010   uterus    There were no vitals filed for this visit.      Subjective Assessment - 10/31/16 1104    Currently in Pain? Yes   Pain Score 1    Pain Location Shoulder   Pain Orientation Right   Pain Descriptors / Indicators Aching   Pain Type Acute pain   Pain Radiating Towards N/A   Pain Onset In the past 7 days   Pain Frequency Intermittent   Aggravating Factors  New exercises   Pain Relieving Factors rest   Effect of Pain on Daily Activities Min effect   Multiple Pain Sites No                      OT Treatments/Exercises (OP) - 10/31/16 1106      Exercises   Exercises Shoulder     Shoulder Exercises: Supine   Protraction PROM;5 reps   Horizontal ABduction PROM;5 reps;Strengthening;12 reps   Horizontal ABduction Weight (lbs) 2   External Rotation PROM;5 reps;AROM;12 reps  shoulder abducted   Internal Rotation PROM;5 reps;AROM;12 reps  shoulder abducted   Flexion PROM;5 reps;Strengthening;12  reps   Shoulder Flexion Weight (lbs) 2   ABduction PROM;5 reps;Strengthening;12 reps   Shoulder ABduction Weight (lbs) 1   Other Supine Exercises serratus anterior punch; 12X; 2#     Shoulder Exercises: Prone   Flexion Strengthening;12 reps   Flexion Weight (lbs) 2   Horizontal ABduction 1 Strengthening;12 reps   Horizontal ABduction 1 Weight (lbs) 2   Horizontal ABduction 2 Strengthening;12 reps   Horizontal ABduction 2 Weight (lbs) 2   Other Prone Exercises protraction; 2#; 12X   Other Prone Exercises Scaption; 2#; 12X     Shoulder Exercises: Sidelying   Flexion Strengthening;12 reps   Flexion Weight (lbs) 2   ABduction Strengthening;12 reps   ABduction Weight (lbs) 2     Shoulder Exercises: ROM/Strengthening   UBE (Upper Arm Bike) Level 3 3' reverse   Ball on Wall 1' flexion 1' abduction green ball     Manual Therapy   Manual Therapy Myofascial release   Manual therapy comments manual therapy completed seperately from all other interventions this date of service   Myofascial Release myofascial release and manual stretching to right upper arm, scapular, and shoulder region to decrease pain and improve pain free mobility  OT Short Term Goals - 10/28/16 1324      OT SHORT TERM GOAL #1   Title Patient will be educated and independent with a HEP for improved shoulder range.   Time 4   Period Weeks   Status On-going     OT SHORT TERM GOAL #2   Title Patient will have WNL A/ROM in her right shoulder in order to reach behind her back to fasten her bra.   Time 4   Period Weeks   Status On-going     OT SHORT TERM GOAL #3   Title Patient will decrease pain in right shoulder to 1/10 or less when sanding furniture.   Time 4   Period Weeks   Status On-going     OT SHORT TERM GOAL #4   Title Patient will decrease fascial restrictions to trace in her right shoulder region for improved pain free range of motion needed to reach behind her back.   Time  4   Period Weeks   Status On-going                  Plan - 10/31/16 1151    Clinical Impression Statement A: Added ball on the wall. patient presents with greater stability this session versus last session. VC for form and technique.    Plan P: Attempt 3# weight for strengthening if able to tolerate. Continue to work on increasing trapezius strength and stability.       Patient will benefit from skilled therapeutic intervention in order to improve the following deficits and impairments:  Decreased range of motion, Increased fascial restricitons, Impaired UE functional use, Increased muscle spasms, Impaired flexibility, Pain  Visit Diagnosis: Acute pain of right shoulder  Stiffness of right shoulder, not elsewhere classified    Problem List There are no active problems to display for this patient.  Limmie Patricia, OTR/L,CBIS  [phone]  10/31/2016, 11:53 AM  [facility] [address] [place], [place], [address] Phone: [phone]   Fax:  [phone]  Name: Gloria Andrews MRN: [id number] Date of Birth: July 22, 1966

## 2016-11-04 ENCOUNTER — Ambulatory Visit (HOSPITAL_COMMUNITY): Payer: PRIVATE HEALTH INSURANCE | Admitting: Occupational Therapy

## 2016-11-06 ENCOUNTER — Ambulatory Visit (HOSPITAL_COMMUNITY): Payer: PRIVATE HEALTH INSURANCE | Admitting: Occupational Therapy

## 2016-11-06 ENCOUNTER — Encounter (HOSPITAL_COMMUNITY): Payer: Self-pay | Admitting: Occupational Therapy

## 2016-11-06 DIAGNOSIS — M25511 Pain in right shoulder: Secondary | ICD-10-CM | POA: Diagnosis not present

## 2016-11-06 DIAGNOSIS — M25611 Stiffness of right shoulder, not elsewhere classified: Secondary | ICD-10-CM

## 2016-11-06 NOTE — Therapy (Addendum)
[place], [place], [address] Phone: [phone]   Fax:  [phone]  Occupational Therapy Treatment  Patient Details  Name: Gloria Andrews MRN: [id number] Date of Birth: 10-21-1965 Referring Provider: Dr. Vickki Hearing  Encounter Date: 11/06/2016      OT End of Session - 11/06/16 1010    Visit Number 4   Number of Visits 8   Date for OT Re-Evaluation 11/20/16   Authorization Type [employer]   Authorization Time Period 12 weeks   Authorization - Visit Number 4   Authorization - Number of Visits 24   OT Start Time 0900   OT Stop Time 0943   OT Time Calculation (min) 43 min   Activity Tolerance Patient tolerated treatment well   Behavior During Therapy [facility] for tasks assessed/performed      Past Medical History:  Diagnosis Date  . GERD (gastroesophageal reflux disease)     Past Surgical History:  Procedure Laterality Date  . LAPAROSCOPIC CHOLECYSTECTOMY  1995  . LASER ABLATION  2010   uterus    There were no vitals filed for this visit.      Subjective Assessment - 11/06/16 0858    Subjective  S: It's been hurting more since Friday.    Currently in Pain? Yes   Pain Score 3   with movement   Pain Location Shoulder   Pain Orientation Right   Pain Descriptors / Indicators Aching   Pain Type Acute pain   Pain Radiating Towards n/a   Pain Onset In the past 7 days   Pain Frequency Intermittent   Aggravating Factors  strengthening exercises   Pain Relieving Factors rest   Effect of Pain on Daily Activities min effect on daily tasks.            [facility] OT Assessment - 11/06/16 0857      Assessment   Diagnosis Right Shoulder Pain     Precautions   Precautions None                  OT Treatments/Exercises (OP) - 11/06/16 0858      Exercises   Exercises Shoulder     Shoulder Exercises: Supine   Protraction PROM;5 reps   Horizontal ABduction PROM;5 reps   External Rotation  PROM;5 reps   Internal Rotation PROM;5 reps   Flexion PROM;5 reps   ABduction PROM;5 reps     Shoulder Exercises: Sidelying   Flexion Strengthening;12 reps   Flexion Weight (lbs) 1   ABduction Strengthening;12 reps   ABduction Weight (lbs) 1   Other Sidelying Exercises protraction/retraction, 10X 1#     Shoulder Exercises: Standing   Extension Theraband;10 reps   Theraband Level (Shoulder Extension) Level 2 (Red)   Row Theraband;10 reps   Theraband Level (Shoulder Row) Level 2 (Red)   Retraction Theraband;10 reps   Theraband Level (Shoulder Retraction) Level 2 (Red)   Shoulder Elevation Strengthening;10 reps  2#     Shoulder Exercises: ROM/Strengthening   Ball on Wall 1' flexion green ball     Shoulder Exercises: Stretch   Corner Stretch 2 reps;20 seconds   Wall Stretch - Flexion 2 reps;20 seconds     Manual Therapy   Manual Therapy Myofascial release   Manual therapy comments manual therapy completed seperately from all other interventions this date of service   Myofascial Release myofascial release and manual stretching to right upper arm, scapular, and shoulder region to decrease pain and improve  pain free mobility                  OT Short Term Goals - 10/28/16 1324      OT SHORT TERM GOAL #1   Title Patient will be educated and independent with a HEP for improved shoulder range.   Time 4   Period Weeks   Status On-going     OT SHORT TERM GOAL #2   Title Patient will have WNL A/ROM in her right shoulder in order to reach behind her back to fasten her bra.   Time 4   Period Weeks   Status On-going     OT SHORT TERM GOAL #3   Title Patient will decrease pain in right shoulder to 1/10 or less when sanding furniture.   Time 4   Period Weeks   Status On-going     OT SHORT TERM GOAL #4   Title Patient will decrease fascial restrictions to trace in her right shoulder region for improved pain free range of motion needed to reach behind her back.   Time  4   Period Weeks   Status On-going                  Plan - 11/06/16 1011    Clinical Impression Statement A: Pt reporting increased pain with all movement since previous session mainly along biceps and upper arm; no pain at rest. Session focused on manual therapy and strengthening within pain limits. Completed stretches this session, educated on performing stretches at home and alternating with A/ROM exercises. Also educated on heat/ice for increased pain.    Plan P: Follow up on soreness, continue with strengthening attempting prone exercises with 1# or 2# weight.    OT Home Exercise Plan 10/21/16:  shoulder stretches   Consulted and Agree with Plan of Care Patient      Patient will benefit from skilled therapeutic intervention in order to improve the following deficits and impairments:  Decreased range of motion, Increased fascial restricitons, Impaired UE functional use, Increased muscle spasms, Impaired flexibility, Pain  Visit Diagnosis: Acute pain of right shoulder  Stiffness of right shoulder, not elsewhere classified    Problem List There are no active problems to display for this patient.  Guadelupe Sabin, OTR/L  [phone] 11/06/2016, 10:21 AM  [place] [address] [place], [place], [address] Phone: [phone]   Fax:  [phone]  Name: Gloria Andrews MRN: [id number] Date of Birth: 05-Jun-1966      OCCUPATIONAL THERAPY DISCHARGE SUMMARY  Visits from Start of Care: 4  Current functional level related to goals / functional outcomes: Pt with improved range of motion and strength, as well as activity tolerance, since evaluation. Pt also demonstrates improved stability with functional tasks. Pt is requesting to be discharged, no reason stated, therefore updated measurements not available.    Remaining deficits: Continues to report pain/soreness of RUE with movement.    Education / Equipment: HEP updated  throughout treatment-stretches, RUE ROM and strengthening  Plan: Patient agrees to discharge.  Patient goals were not met. Patient is being discharged due to the patient's request.  ?????

## 2016-11-10 ENCOUNTER — Telehealth (HOSPITAL_COMMUNITY): Payer: Self-pay | Admitting: Specialist

## 2016-11-10 NOTE — Telephone Encounter (Signed)
Pt called and wants to cx all future apptments - she doesn't want to continue treatment at this time,per CR/NF.

## 2016-11-11 ENCOUNTER — Ambulatory Visit (HOSPITAL_COMMUNITY): Payer: PRIVATE HEALTH INSURANCE | Admitting: Occupational Therapy

## 2016-11-13 ENCOUNTER — Ambulatory Visit (HOSPITAL_COMMUNITY): Payer: PRIVATE HEALTH INSURANCE | Admitting: Occupational Therapy

## 2016-11-18 ENCOUNTER — Encounter (HOSPITAL_COMMUNITY): Payer: PRIVATE HEALTH INSURANCE | Admitting: Occupational Therapy

## 2016-11-20 ENCOUNTER — Encounter (HOSPITAL_COMMUNITY): Payer: PRIVATE HEALTH INSURANCE | Admitting: Occupational Therapy

## 2018-10-04 ENCOUNTER — Other Ambulatory Visit: Payer: Self-pay | Admitting: Obstetrics and Gynecology

## 2018-10-04 DIAGNOSIS — R928 Other abnormal and inconclusive findings on diagnostic imaging of breast: Secondary | ICD-10-CM

## 2018-10-11 ENCOUNTER — Ambulatory Visit: Payer: PRIVATE HEALTH INSURANCE

## 2018-10-11 ENCOUNTER — Ambulatory Visit
Admission: RE | Admit: 2018-10-11 | Discharge: 2018-10-11 | Disposition: A | Discharge status: Home / Self Care | Payer: PRIVATE HEALTH INSURANCE | Source: Ambulatory Visit | Attending: Obstetrics and Gynecology | Admitting: Obstetrics and Gynecology

## 2018-10-11 ENCOUNTER — Other Ambulatory Visit: Payer: Self-pay

## 2018-10-11 DIAGNOSIS — R928 Other abnormal and inconclusive findings on diagnostic imaging of breast: Secondary | ICD-10-CM

## 2019-08-02 ENCOUNTER — Other Ambulatory Visit: Payer: Self-pay | Admitting: Obstetrics and Gynecology

## 2019-08-02 DIAGNOSIS — Z1231 Encounter for screening mammogram for malignant neoplasm of breast: Secondary | ICD-10-CM

## 2019-10-06 ENCOUNTER — Ambulatory Visit
Admission: RE | Admit: 2019-10-06 | Discharge: 2019-10-06 | Disposition: A | Discharge status: Home / Self Care | Payer: PRIVATE HEALTH INSURANCE | Source: Ambulatory Visit | Attending: Obstetrics and Gynecology | Admitting: Obstetrics and Gynecology

## 2019-10-06 ENCOUNTER — Other Ambulatory Visit: Payer: Self-pay

## 2019-10-06 DIAGNOSIS — Z1231 Encounter for screening mammogram for malignant neoplasm of breast: Secondary | ICD-10-CM

## 2020-01-04 ENCOUNTER — Telehealth (HOSPITAL_COMMUNITY): Payer: Self-pay

## 2020-08-14 ENCOUNTER — Other Ambulatory Visit: Payer: Self-pay | Admitting: Obstetrics and Gynecology

## 2020-08-14 DIAGNOSIS — Z1231 Encounter for screening mammogram for malignant neoplasm of breast: Secondary | ICD-10-CM

## 2020-09-25 ENCOUNTER — Other Ambulatory Visit: Payer: Self-pay

## 2020-09-25 ENCOUNTER — Ambulatory Visit
Admission: RE | Admit: 2020-09-25 | Discharge: 2020-09-25 | Disposition: A | Discharge status: Home / Self Care | Payer: PRIVATE HEALTH INSURANCE | Source: Ambulatory Visit | Attending: Obstetrics and Gynecology | Admitting: Obstetrics and Gynecology

## 2020-09-25 DIAGNOSIS — Z1231 Encounter for screening mammogram for malignant neoplasm of breast: Secondary | ICD-10-CM

## 2021-08-19 ENCOUNTER — Other Ambulatory Visit: Payer: Self-pay | Admitting: Obstetrics and Gynecology

## 2021-08-19 DIAGNOSIS — Z1231 Encounter for screening mammogram for malignant neoplasm of breast: Secondary | ICD-10-CM

## 2021-09-27 ENCOUNTER — Ambulatory Visit
Admission: RE | Admit: 2021-09-27 | Discharge: 2021-09-27 | Disposition: A | Discharge status: Home / Self Care | Payer: PRIVATE HEALTH INSURANCE | Source: Ambulatory Visit | Attending: Obstetrics and Gynecology | Admitting: Obstetrics and Gynecology

## 2021-09-27 DIAGNOSIS — Z1231 Encounter for screening mammogram for malignant neoplasm of breast: Secondary | ICD-10-CM

## 2022-03-05 ENCOUNTER — Ambulatory Visit
Admission: RE | Admit: 2022-03-05 | Discharge: 2022-03-05 | Disposition: A | Discharge status: Home / Self Care | Payer: PRIVATE HEALTH INSURANCE | Source: Ambulatory Visit | Attending: Nurse Practitioner | Admitting: Nurse Practitioner

## 2022-03-05 VITALS — BP 167/80 | HR 64 | Temp 98.2°F | Resp 17

## 2022-03-05 DIAGNOSIS — R300 Dysuria: Secondary | ICD-10-CM | POA: Diagnosis present

## 2022-03-05 LAB — POCT URINALYSIS DIP (MANUAL ENTRY)
Bilirubin, UA: NEGATIVE
Blood, UA: NEGATIVE
Glucose, UA: NEGATIVE mg/dL
Ketones, POC UA: NEGATIVE mg/dL
Leukocytes, UA: NEGATIVE
Nitrite, UA: NEGATIVE
Protein Ur, POC: NEGATIVE mg/dL
Spec Grav, UA: 1.01 (ref 1.010–1.025)
Urobilinogen, UA: 0.2 E.U./dL
pH, UA: 7 (ref 5.0–8.0)

## 2022-03-05 MED ORDER — PHENAZOPYRIDINE HCL 200 MG PO TABS: 200.0000 mg | ORAL_TABLET | Freq: Three times a day (TID) | ORAL | 0 refills | Status: DC | PRN

## 2022-03-05 NOTE — Discharge Instructions (Addendum)
-   The urinalysis today is negative for any signs of infection; we are sending for a urine culture and will let you know if this comes back positive -I suspect you may have a yeast or bacterial vaginosis infection.  We will let you know if this comes back positive on the vaginal swab that we did today. -In the meantime, you can start on pyridium to try to help with the bladder pain. -If symptoms persist or worsen despite treatment, follow-up with OB/GYN.

## 2022-03-05 NOTE — ED Triage Notes (Signed)
Pt presents with urinary frequency, dysuria, abdominal pressure xs 4-5 days.

## 2022-03-05 NOTE — ED Provider Notes (Signed)
[facility]    CSN: [id number] Arrival date & time: 03/05/22  1242      History   Chief Complaint Chief Complaint  Patient presents with   Urinary Frequency    APPT    HPI Nakyra Bourn is a 56 y.o. female.   Patient presents with a few days of burning with urination, increased urinary frequency, and voiding small amounts.  Reports she has pain/burning the entire time she urinates, wonders if she may have some vaginal irritation.  Denies any new rash, lesions, open sores on her genitalia.  She denies new urinary incontinence, malodorous urine, hematuria, abdominal pain, new back pain/flank pain, suprapubic pain/pressure, fever, nausea/vomiting, and vaginal discharge.  She reports a history of bacterial vaginosis and had similar symptoms last time she had it.  She denies antibiotic therapy in the past 90 days.  She has been trying to drink a lot of water to help with symptoms which does seem to help.  She is not concerned about STI today.    Past Medical History:  Diagnosis Date   GERD (gastroesophageal reflux disease)     There are no problems to display for this patient.   Past Surgical History:  Procedure Laterality Date   LAPAROSCOPIC CHOLECYSTECTOMY  1995   LASER ABLATION  2010   uterus    OB History   No obstetric history on file.      Home Medications    Prior to Admission medications   Medication Sig Start Date End Date Taking? Authorizing Provider  phenazopyridine (PYRIDIUM) 200 MG tablet Take 1 tablet (200 mg total) by mouth 3 (three) times daily as needed for pain. 03/05/22  Yes Valentino Nose, NP  calcium carbonate (OSCAL) 1500 (600 Ca) MG TABS tablet Take 600 mg of elemental calcium by mouth daily with breakfast.    [provider]  CLIMARA PRO 0.045-0.015 MG/DAY APP 1 PA EXT TO THE SKIN WEEKLY UTD 08/14/15   [provider]  Multiple Vitamin (MULTIVITAMIN) tablet Take 1 tablet by mouth daily.    [provider]  Omega-3 Fatty Acids (OMEGA 3 PO) Take 2 capsules by mouth daily.    [provider]  omeprazole (PRILOSEC OTC) 20 MG tablet Take 20 mg by mouth daily.    [provider]  omeprazole (PRILOSEC) 20 MG capsule Take 20 mg by mouth daily. 02/25/22   [provider]  rosuvastatin (CRESTOR) 5 MG tablet Take 5 mg by mouth daily. 12/05/21   [provider]    Family History Family History  Problem Relation Age of Onset   Breast cancer Mother    Cancer - Other Father    Colon cancer Neg Hx     Social History Social History   Tobacco Use   Smoking status: Never   Smokeless tobacco: Never  Substance Use Topics   Alcohol use: No    Alcohol/week: 0.0 standard drinks of alcohol   Drug use: No     Allergies   Patient has no known allergies.   Review of Systems Review of Systems Per HPI  Physical Exam Triage Vital Signs ED Triage Vitals  Enc Vitals Group     BP 03/05/22 1257 (!) 167/80     Pulse Rate 03/05/22 1257 64     Resp 03/05/22 1257 17     Temp 03/05/22 1257 98.2 F (36.8 C)     Temp Source 03/05/22 1257 Oral     SpO2 03/05/22 1257 98 %  Weight --      Height --      Head Circumference --      Peak Flow --      Pain Score 03/05/22 1255 3     Pain Loc --      Pain Edu? --      Excl. in [place]? --    No data found.  Updated Vital Signs BP (!) 167/80 (BP Location: Right Arm)   Pulse 64   Temp 98.2 F (36.8 C) (Oral)   Resp 17   SpO2 98%   Visual Acuity Right Eye Distance:   Left Eye Distance:   Bilateral Distance:    Right Eye Near:   Left Eye Near:    Bilateral Near:     Physical Exam Vitals and nursing note reviewed.  Constitutional:      General: She is not in acute distress.    Appearance: She is not toxic-appearing.  HENT:     Mouth/Throat:     Mouth: Mucous membranes are moist.     Pharynx: Oropharynx is clear.  Pulmonary:     Effort: Pulmonary effort is normal. No respiratory distress.   Abdominal:     General: Abdomen is flat. Bowel sounds are normal. There is no distension.     Palpations: Abdomen is soft. There is no mass.     Tenderness: There is no abdominal tenderness. There is no right CVA tenderness, left CVA tenderness or guarding.  Genitourinary:    Comments: Deferred-self swab obtained Skin:    General: Skin is warm and dry.     Coloration: Skin is not jaundiced or pale.     Findings: No erythema.  Neurological:     Mental Status: She is alert and oriented to person, place, and time.  Psychiatric:        Behavior: Behavior is cooperative.      UC Treatments / Results  Labs (all labs ordered are listed, but only abnormal results are displayed) Labs Reviewed  URINE CULTURE  POCT URINALYSIS DIP (MANUAL ENTRY)  CERVICOVAGINAL ANCILLARY ONLY    EKG   Radiology No results found.  Procedures Procedures (including critical care time)  Medications Ordered in UC Medications - No data to display  Initial Impression / Assessment and Plan / UC Course  I have reviewed the triage vital signs and the nursing notes.  Pertinent labs & imaging results that were available during my care of the patient were reviewed by me and considered in my medical decision making (see chart for details).    Patient is a very pleasant, well-appearing 56 year old female presenting for dysuria, possible vaginal irritation today.  Urinalysis today is negative for any signs of infection.  Given symptoms, will send for urine culture and treat if this comes back positive showing UTI.  In the meantime, she can start on Pyridium.  We also obtained a self swab today to check for bacterial vaginosis and yeast vaginitis.  She denies concern about STI and testing deferred at this time.  Treat as indicated with positive results.  Seek care with OB/GYN if symptoms persist or worsen.  The patient was given the opportunity to ask questions.  All questions answered to their satisfaction.  The  patient is in agreement to this plan.   Final Clinical Impressions(s) / UC Diagnoses   Final diagnoses:  Dysuria     Discharge Instructions      - The urinalysis today is negative for any signs of infection; we are  sending for a urine culture and will let you know if this comes back positive -I suspect you may have a yeast or bacterial vaginosis infection.  We will let you know if this comes back positive on the vaginal swab that we did today. -In the meantime, you can start on pyridium to try to help with the bladder pain. -If symptoms persist or worsen despite treatment, follow-up with OB/GYN.    ED Prescriptions     Medication Sig Dispense Auth. Provider   phenazopyridine (PYRIDIUM) 200 MG tablet Take 1 tablet (200 mg total) by mouth 3 (three) times daily as needed for pain. 6 tablet Valentino Nose, NP      PDMP not reviewed this encounter.   Valentino Nose, NP 03/05/22 1358

## 2022-03-07 LAB — CERVICOVAGINAL ANCILLARY ONLY
Bacterial Vaginitis (gardnerella): NEGATIVE
Candida Glabrata: NEGATIVE
Candida Vaginitis: NEGATIVE
Comment: NEGATIVE
Comment: NEGATIVE
Comment: NEGATIVE

## 2022-03-08 LAB — URINE CULTURE: Culture: 10000 — AB

## 2022-03-10 ENCOUNTER — Telehealth (HOSPITAL_COMMUNITY): Payer: Self-pay | Admitting: Emergency Medicine

## 2022-03-10 MED ORDER — SULFAMETHOXAZOLE-TRIMETHOPRIM 800-160 MG PO TABS: 1.0000 | ORAL_TABLET | Freq: Two times a day (BID) | ORAL | 0 refills | Status: AC

## 2022-09-02 ENCOUNTER — Ambulatory Visit
Admission: RE | Admit: 2022-09-02 | Discharge: 2022-09-02 | Disposition: A | Discharge status: Home / Self Care | Payer: PRIVATE HEALTH INSURANCE | Source: Ambulatory Visit | Attending: Nurse Practitioner | Admitting: Nurse Practitioner

## 2022-09-02 VITALS — BP 132/79 | HR 87 | Temp 99.9°F | Resp 18

## 2022-09-02 DIAGNOSIS — R6889 Other general symptoms and signs: Secondary | ICD-10-CM

## 2022-09-02 LAB — POCT INFLUENZA A/B
Influenza A, POC: NEGATIVE
Influenza B, POC: NEGATIVE

## 2022-09-02 MED ORDER — BENZONATATE 100 MG PO CAPS: 100.0000 mg | ORAL_CAPSULE | Freq: Three times a day (TID) | ORAL | 0 refills | Status: DC | PRN

## 2022-09-02 MED ORDER — OSELTAMIVIR PHOSPHATE 75 MG PO CAPS: 75.0000 mg | ORAL_CAPSULE | Freq: Two times a day (BID) | ORAL | 0 refills | Status: DC

## 2022-09-02 NOTE — Discharge Instructions (Addendum)
The influenza test today is negative.  I suspect this is a false negative and I think you have the flu.  You can start the Tamiflu to hopefully help shorten your symptoms and make you feel better sooner.    Symptoms should improve over the next week to 10 days.  If you develop chest pain or shortness of breath, go to the emergency room.  Some things that can make you feel better are: - Increased rest - Increasing fluid with water/sugar free electrolytes - Acetaminophen and ibuprofen as needed for fever/pain - Salt water gargling, chloraseptic spray and throat lozenges for sore throat - OTC guaifenesin (Mucinex) 600 mg twice daily for congestion - Saline sinus flushes or a neti pot for sore throat - Humidifying the air - Tessalon perles every 8 hours as needed for dry cough

## 2022-09-02 NOTE — ED Provider Notes (Signed)
[facility]    CSN: [id number] Arrival date & time: 09/02/22  1429      History   Chief Complaint Chief Complaint  Patient presents with   Cough    Aches, chills, cough - Entered by patient    HPI Gloria Andrews is a 57 y.o. female.   Patient presents today with 1 day history of chills, low-grade fever, occasional congested cough, nasal congestion and runny nose, sore throat, headache, slight ear pain, decreased appetite, and fatigue.  She denies shortness of breath or chest pain, chest congestion, abdominal pain, nausea/vomiting, diarrhea, and loss of taste or smell.  No known sick contacts.  Reports she tested positive for COVID-19 on 08/18/2022 but symptoms have improved since then and symptoms of this illness started abruptly yesterday.  Is been taking Motrin, Aleve, and Robitussin with honey for symptoms with mild benefit.     Past Medical History:  Diagnosis Date   GERD (gastroesophageal reflux disease)     There are no problems to display for this patient.   Past Surgical History:  Procedure Laterality Date   LAPAROSCOPIC CHOLECYSTECTOMY  1995   LASER ABLATION  2010   uterus    OB History   No obstetric history on file.      Home Medications    Prior to Admission medications   Medication Sig Start Date End Date Taking? Authorizing Provider  benzonatate (TESSALON) 100 MG capsule Take 1 capsule (100 mg total) by mouth 3 (three) times daily as needed for cough. Do not take with alcohol or while driving or operating heavy machinery.  May cause drowsiness. 09/02/22  Yes Eulogio Bear, NP  oseltamivir (TAMIFLU) 75 MG capsule Take 1 capsule (75 mg total) by mouth 2 (two) times daily. 09/02/22  Yes Eulogio Bear, NP  calcium carbonate (OSCAL) 1500 (600 Ca) MG TABS tablet Take 600 mg of elemental calcium by mouth daily with breakfast.    [provider]  CLIMARA PRO 0.045-0.015 MG/DAY APP 1 PA EXT TO THE SKIN WEEKLY UTD 08/14/15    [provider]  Multiple Vitamin (MULTIVITAMIN) tablet Take 1 tablet by mouth daily.    [provider]  Omega-3 Fatty Acids (OMEGA 3 PO) Take 2 capsules by mouth daily.    [provider]  omeprazole (PRILOSEC OTC) 20 MG tablet Take 20 mg by mouth daily.    [provider]  omeprazole (PRILOSEC) 20 MG capsule Take 20 mg by mouth daily. 02/25/22   [provider]  rosuvastatin (CRESTOR) 5 MG tablet Take 5 mg by mouth daily. 12/05/21   [provider]    Family History Family History  Problem Relation Age of Onset   Breast cancer Mother    Cancer - Other Father    Colon cancer Neg Hx     Social History Social History   Tobacco Use   Smoking status: Never   Smokeless tobacco: Never  Substance Use Topics   Alcohol use: No    Alcohol/week: 0.0 standard drinks of alcohol   Drug use: No     Allergies   Patient has no known allergies.   Review of Systems Review of Systems Per HPI  Physical Exam Triage Vital Signs ED Triage Vitals  Enc Vitals Group     BP 09/02/22 1514 132/79     Pulse Rate 09/02/22 1514 87     Resp 09/02/22 1514 18     Temp 09/02/22 1514 99.9 F (37.7 C)  Temp Source 09/02/22 1514 Oral     SpO2 09/02/22 1514 97 %     Weight --      Height --      Head Circumference --      Peak Flow --      Pain Score 09/02/22 1515 7     Pain Loc --      Pain Edu? --      Excl. in [place]? --    No data found.  Updated Vital Signs BP 132/79 (BP Location: Right Arm)   Pulse 87   Temp 99.9 F (37.7 C) (Oral)   Resp 18   SpO2 97%   Visual Acuity Right Eye Distance:   Left Eye Distance:   Bilateral Distance:    Right Eye Near:   Left Eye Near:    Bilateral Near:     Physical Exam Vitals and nursing note reviewed.  Constitutional:      General: She is not in acute distress.    Appearance: Normal appearance. She is not ill-appearing or toxic-appearing.  HENT:     Head: Normocephalic and atraumatic.      Right Ear: Tympanic membrane, ear canal and external ear normal.     Left Ear: Tympanic membrane, ear canal and external ear normal.     Nose: No congestion or rhinorrhea.     Mouth/Throat:     Mouth: Mucous membranes are moist.     Pharynx: Oropharynx is clear. Posterior oropharyngeal erythema present. No oropharyngeal exudate.  Eyes:     General: No scleral icterus.    Extraocular Movements: Extraocular movements intact.  Cardiovascular:     Rate and Rhythm: Normal rate and regular rhythm.  Pulmonary:     Effort: Pulmonary effort is normal. No respiratory distress.     Breath sounds: Normal breath sounds. No wheezing, rhonchi or rales.  Abdominal:     General: Abdomen is flat. Bowel sounds are normal. There is no distension.     Palpations: Abdomen is soft.     Tenderness: There is no abdominal tenderness. There is no guarding.  Musculoskeletal:     Cervical back: Normal range of motion and neck supple.  Lymphadenopathy:     Cervical: No cervical adenopathy.  Skin:    General: Skin is warm and dry.     Coloration: Skin is not jaundiced or pale.     Findings: No erythema or rash.  Neurological:     Mental Status: She is alert and oriented to person, place, and time.  Psychiatric:        Behavior: Behavior is cooperative.      UC Treatments / Results  Labs (all labs ordered are listed, but only abnormal results are displayed) Labs Reviewed  POCT INFLUENZA A/B    EKG   Radiology No results found.  Procedures Procedures (including critical care time)  Medications Ordered in UC Medications - No data to display  Initial Impression / Assessment and Plan / UC Course  I have reviewed the triage vital signs and the nursing notes.  Pertinent labs & imaging results that were available during my care of the patient were reviewed by me and considered in my medical decision making (see chart for details).   Patient is well-appearing, normotensive, afebrile, not  tachycardic, not tachypneic, oxygenating well on room air.    1. Flu-like symptoms Suspect viral etiology High suspicion for influenza Rapid flu A&B test today are negative Will treat empirically with Tamiflu twice daily for 5  days Supportive care discussed Note given for work ER and return precautions also discussed  The patient was given the opportunity to ask questions.  All questions answered to their satisfaction.  The patient is in agreement to this plan.    Final Clinical Impressions(s) / UC Diagnoses   Final diagnoses:  Flu-like symptoms     Discharge Instructions      The influenza test today is negative.  I suspect this is a false negative and I think you have the flu.  You can start the Tamiflu to hopefully help shorten your symptoms and make you feel better sooner.    Symptoms should improve over the next week to 10 days.  If you develop chest pain or shortness of breath, go to the emergency room.  Some things that can make you feel better are: - Increased rest - Increasing fluid with water/Gloria free electrolytes - Acetaminophen and ibuprofen as needed for fever/pain - Salt water gargling, chloraseptic spray and throat lozenges for sore throat - OTC guaifenesin (Mucinex) 600 mg twice daily for congestion - Saline sinus flushes or a neti pot for sore throat - Humidifying the air - Tessalon perles every 8 hours as needed for dry cough     ED Prescriptions     Medication Sig Dispense Auth. Provider   oseltamivir (TAMIFLU) 75 MG capsule Take 1 capsule (75 mg total) by mouth 2 (two) times daily. 10 capsule Noemi Chapel A, NP   benzonatate (TESSALON) 100 MG capsule Take 1 capsule (100 mg total) by mouth 3 (three) times daily as needed for cough. Do not take with alcohol or while driving or operating heavy machinery.  May cause drowsiness. 21 capsule Eulogio Bear, NP      PDMP not reviewed this encounter.   Eulogio Bear, NP 09/02/22 [phone]

## 2022-09-02 NOTE — ED Triage Notes (Signed)
Pt reports body aches, coughing, and chills x 1 day    Pt had covid on 08/18/22 but says she got over that now has new sxs.

## 2022-09-09 ENCOUNTER — Other Ambulatory Visit: Payer: Self-pay | Admitting: Obstetrics and Gynecology

## 2022-09-09 DIAGNOSIS — Z1231 Encounter for screening mammogram for malignant neoplasm of breast: Secondary | ICD-10-CM

## 2022-10-22 ENCOUNTER — Ambulatory Visit: Payer: PRIVATE HEALTH INSURANCE

## 2022-12-03 ENCOUNTER — Ambulatory Visit
Admission: RE | Admit: 2022-12-03 | Discharge: 2022-12-03 | Disposition: A | Discharge status: Home / Self Care | Payer: PRIVATE HEALTH INSURANCE | Source: Ambulatory Visit | Attending: Obstetrics and Gynecology | Admitting: Obstetrics and Gynecology

## 2022-12-03 DIAGNOSIS — Z1231 Encounter for screening mammogram for malignant neoplasm of breast: Secondary | ICD-10-CM

## 2023-07-24 IMAGING — MG MM DIGITAL SCREENING BILAT W/ TOMO AND CAD
8 series · 8 of 24 positions shown · non-contrast
Comparison: Previous exam(s).

CLINICAL DATA: Screening.

EXAM:
DIGITAL SCREENING BILATERAL MAMMOGRAM WITH TOMOSYNTHESIS AND CAD
TECHNIQUE: Bilateral screening digital craniocaudal and mediolateral oblique
mammograms were obtained. Bilateral screening digital breast
tomosynthesis was performed. The images were evaluated with
computer-aided detection.

[L MLO synth-2D]
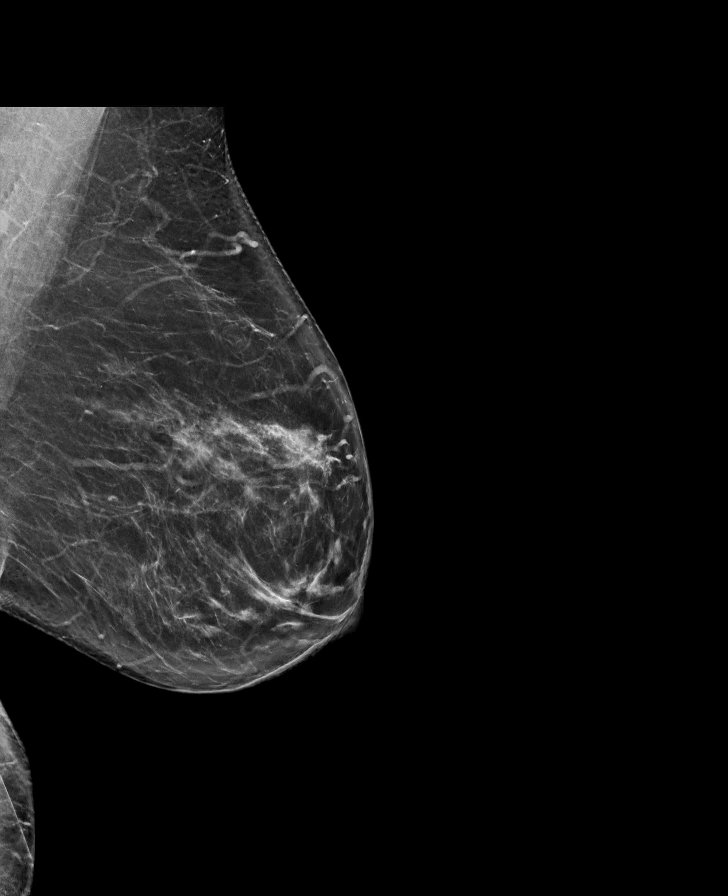

[L CC synth-2D]
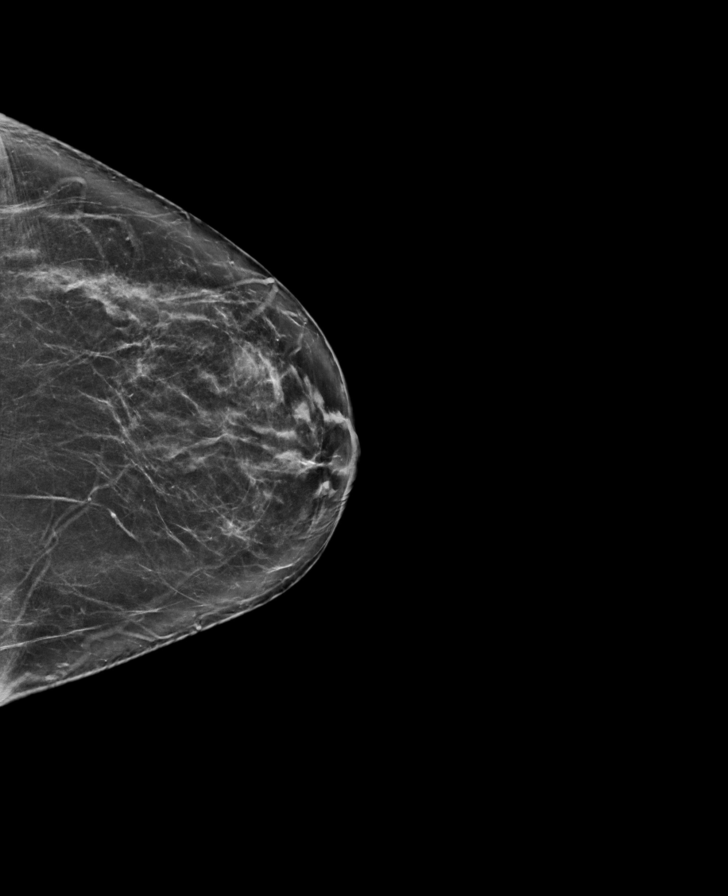

[R MLO synth-2D]
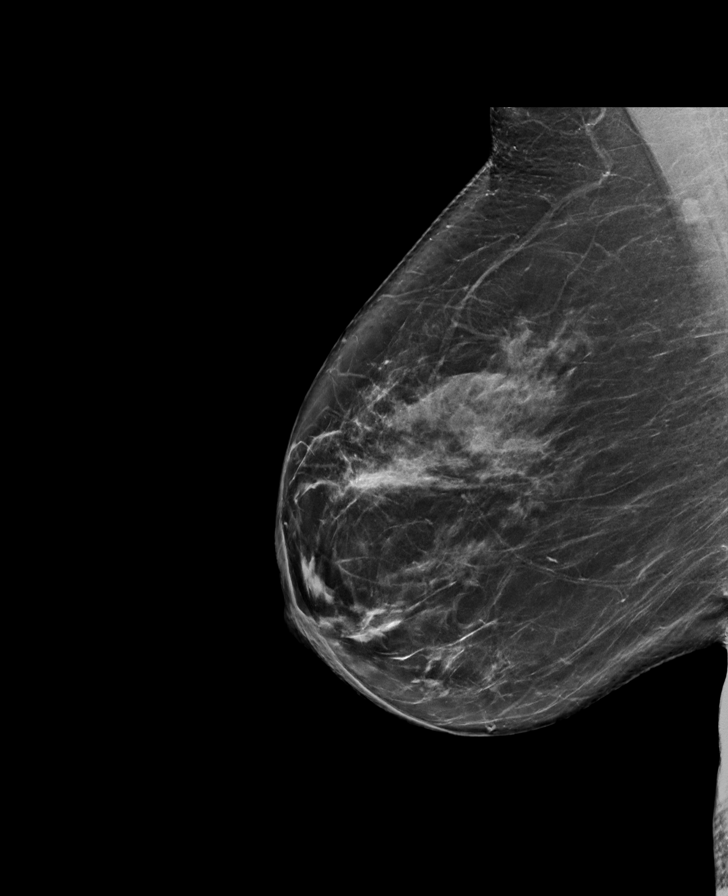

[R CC synth-2D]
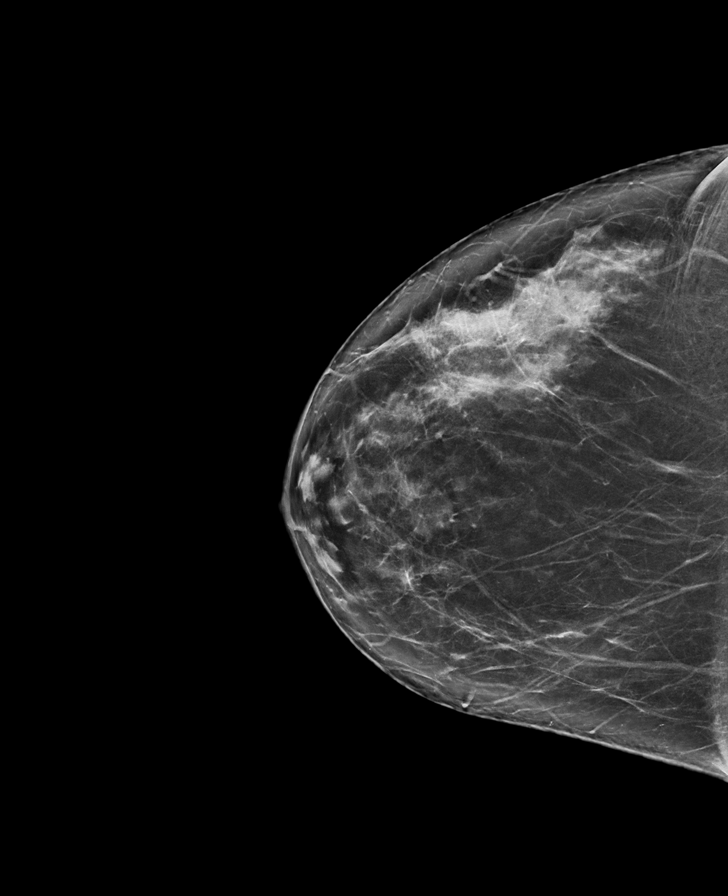

[R MLO tomo · tomo slice 43/84.0]
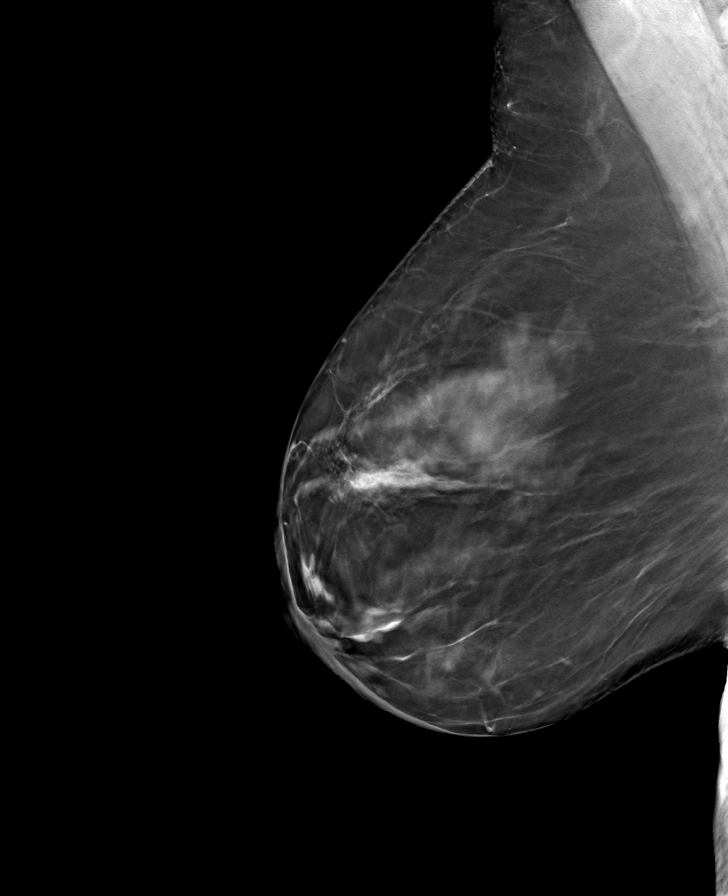

[L CC tomo · tomo slice 40/79.0]
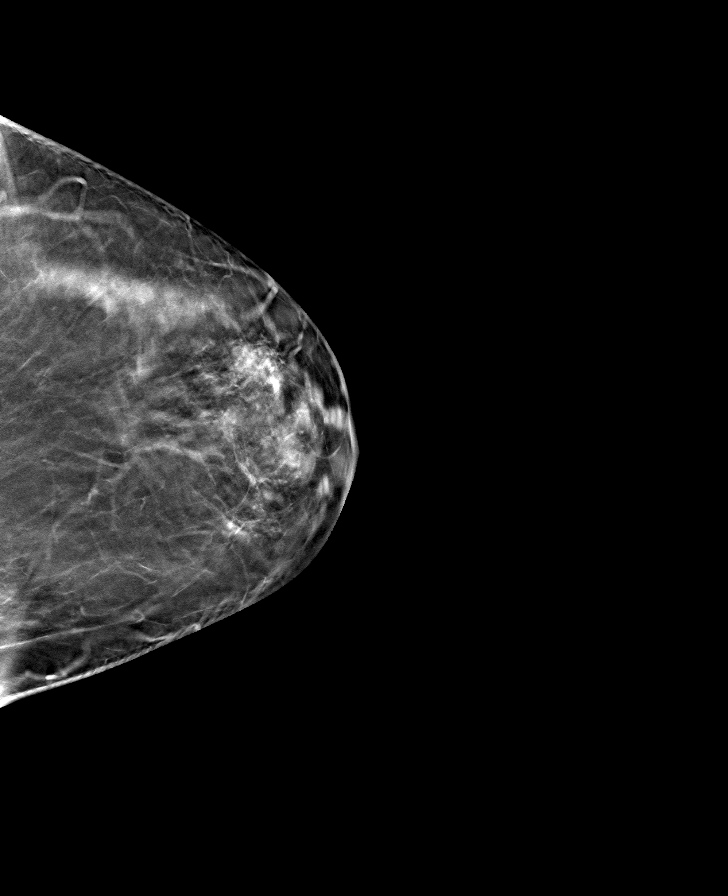

[L MLO tomo · tomo slice 41/81.0]
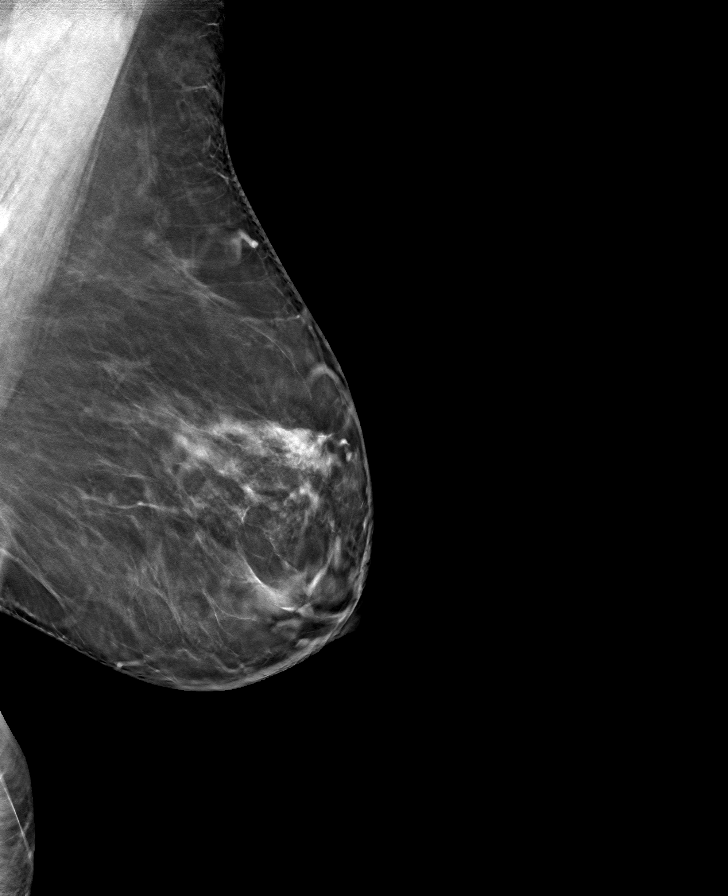

[R CC tomo · tomo slice 41/80.0]
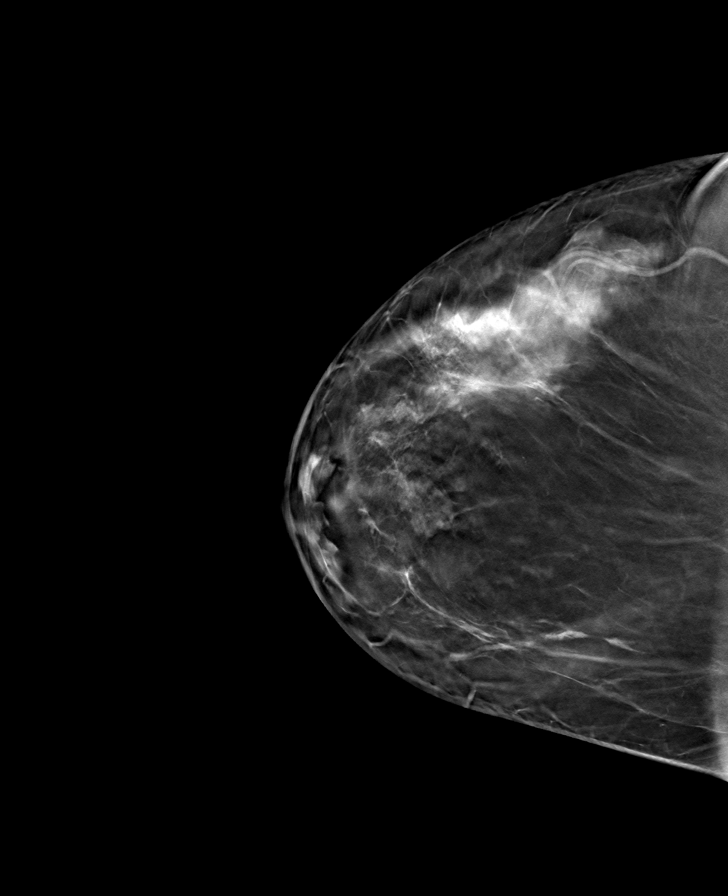

[8 of 24 positions shown; findings below may reference images not displayed]

ACR Breast Density Category b: There are scattered areas of
fibroglandular density.
FINDINGS: There are no findings suspicious for malignancy.
IMPRESSION: No mammographic evidence of malignancy. A result letter of this
screening mammogram will be mailed directly to the patient.

RECOMMENDATION:
Screening mammogram in one year. (Code:51-O-LD2)

BI-RADS CATEGORY  1: Negative.

## 2023-08-07 ENCOUNTER — Other Ambulatory Visit: Payer: Self-pay | Admitting: Obstetrics and Gynecology

## 2023-08-07 DIAGNOSIS — Z0000 Encounter for general adult medical examination without abnormal findings: Secondary | ICD-10-CM

## 2023-10-08 ENCOUNTER — Other Ambulatory Visit: Payer: Self-pay | Admitting: Obstetrics and Gynecology

## 2023-10-08 ENCOUNTER — Encounter: Payer: Self-pay | Admitting: Obstetrics and Gynecology

## 2023-10-08 DIAGNOSIS — R928 Other abnormal and inconclusive findings on diagnostic imaging of breast: Secondary | ICD-10-CM

## 2023-10-15 ENCOUNTER — Ambulatory Visit
Admission: RE | Admit: 2023-10-15 | Discharge: 2023-10-15 | Disposition: A | Discharge status: Home / Self Care | Payer: PRIVATE HEALTH INSURANCE | Source: Ambulatory Visit | Attending: Obstetrics and Gynecology | Admitting: Obstetrics and Gynecology

## 2023-10-15 DIAGNOSIS — R928 Other abnormal and inconclusive findings on diagnostic imaging of breast: Secondary | ICD-10-CM

## 2023-11-03 ENCOUNTER — Ambulatory Visit
Admission: RE | Admit: 2023-11-03 | Discharge: 2023-11-03 | Disposition: A | Discharge status: Home / Self Care | Source: Ambulatory Visit

## 2023-11-03 ENCOUNTER — Ambulatory Visit (INDEPENDENT_AMBULATORY_CARE_PROVIDER_SITE_OTHER)

## 2023-11-03 VITALS — BP 140/71 | HR 82 | Temp 98.1°F | Resp 18

## 2023-11-03 DIAGNOSIS — J069 Acute upper respiratory infection, unspecified: Secondary | ICD-10-CM | POA: Diagnosis not present

## 2023-11-03 DIAGNOSIS — R062 Wheezing: Secondary | ICD-10-CM

## 2023-11-03 MED ORDER — PREDNISONE 20 MG PO TABS: 40.0000 mg | ORAL_TABLET | Freq: Every day | ORAL | 0 refills | Status: AC

## 2023-11-03 MED ORDER — PROMETHAZINE-DM 6.25-15 MG/5ML PO SYRP: 5.0000 mL | ORAL_SOLUTION | Freq: Every evening | ORAL | 0 refills | Status: AC | PRN

## 2023-11-03 MED ORDER — ALBUTEROL SULFATE (2.5 MG/3ML) 0.083% IN NEBU
2.5000 mg | INHALATION_SOLUTION | Freq: Once | RESPIRATORY_TRACT | Status: AC
  Administered 2023-11-03: 2.5 mg via RESPIRATORY_TRACT

## 2023-11-03 MED ORDER — ALBUTEROL SULFATE HFA 108 (90 BASE) MCG/ACT IN AERS: 1.0000 | INHALATION_SPRAY | Freq: Four times a day (QID) | RESPIRATORY_TRACT | 0 refills | Status: AC | PRN

## 2023-11-03 NOTE — ED Provider Notes (Signed)
 [facility]    CSN: [id number] Arrival date & time: 11/03/23  1242      History   Chief Complaint Chief Complaint  Patient presents with   Cough    I have felt bad since last Wednesday, allergy related symptoms. Deep cough with phlegm. - Entered by patient    HPI Gloria Andrews is a 58 y.o. female.   Patient presents today with 6-day history of low-grade fever, body aches that are now improved, congested cough and coughing up clear mucus occasionally, runny and stuffy nose, sneezing, watery eyes, scratchy throat, fatigue.  Reports last night, she noticed wheezing when she lay down at night.  She denies shortness of breath or chest pain, headache, ear pain, abdominal pain, nausea/vomiting, diarrhea, and change in appetite.  Has taken over-the-counter allergy medicine and Flonase which did seem to help with symptoms temporarily.  Patient denies history of smoking or vaping.  Denies ever having asthma or needing an inhaler in the past.  Reports she has not ever had wheezing before.    Past Medical History:  Diagnosis Date   GERD (gastroesophageal reflux disease)     There are no active problems to display for this patient.   Past Surgical History:  Procedure Laterality Date   LAPAROSCOPIC CHOLECYSTECTOMY  1995   LASER ABLATION  2010   uterus    OB History   No obstetric history on file.      Home Medications    Prior to Admission medications   Medication Sig Start Date End Date Taking? Authorizing Provider  albuterol (VENTOLIN HFA) 108 (90 Base) MCG/ACT inhaler Inhale 1-2 puffs into the lungs every 6 (six) hours as needed for wheezing or shortness of breath. 11/03/23  Yes Valentino Nose, NP  predniSONE (DELTASONE) 20 MG tablet Take 2 tablets (40 mg total) by mouth daily with breakfast for 5 days. 11/03/23 11/08/23 Yes Valentino Nose, NP  promethazine-dextromethorphan (PROMETHAZINE-DM) 6.25-15 MG/5ML syrup Take 5 mLs by mouth at bedtime as needed  for cough. Do not take with alcohol or while driving or operating heavy machinery.  May cause drowsiness. 11/03/23  Yes Cathlean Marseilles A, NP  amLODipine (NORVASC) 5 MG tablet Take 1 tablet by mouth daily.    [provider]  calcium carbonate (OSCAL) 1500 (600 Ca) MG TABS tablet Take 600 mg of elemental calcium by mouth daily with breakfast.    [provider]  CLIMARA PRO 0.045-0.015 MG/DAY APP 1 PA EXT TO THE SKIN WEEKLY UTD 08/14/15   [provider]  Multiple Vitamin (MULTIVITAMIN) tablet Take 1 tablet by mouth daily.    [provider]  Omega-3 Fatty Acids (OMEGA 3 PO) Take 2 capsules by mouth daily.    [provider]  omeprazole (PRILOSEC OTC) 20 MG tablet Take 20 mg by mouth daily.    [provider]  omeprazole (PRILOSEC) 20 MG capsule Take 20 mg by mouth daily. 02/25/22   [provider]  rosuvastatin (CRESTOR) 5 MG tablet Take 5 mg by mouth daily. 12/05/21   [provider]    Family History Family History  Problem Relation Age of Onset   Breast cancer Mother    Cancer - Other Father    Colon cancer Neg Hx     Social History Social History   Tobacco Use   Smoking status: Never   Smokeless tobacco: Never  Substance Use Topics   Alcohol use: No    Alcohol/week: 0.0 standard drinks of alcohol  Drug use: No     Allergies   Patient has no known allergies.   Review of Systems Review of Systems Per HPI  Physical Exam Triage Vital Signs ED Triage Vitals  Encounter Vitals Group     BP 11/03/23 1256 (!) 140/71     Systolic BP Percentile --      Diastolic BP Percentile --      Pulse Rate 11/03/23 1256 82     Resp 11/03/23 1256 18     Temp 11/03/23 1256 98.1 F (36.7 C)     Temp Source 11/03/23 1256 Oral     SpO2 11/03/23 1256 92 %     Weight --      Height --      Head Circumference --      Peak Flow --      Pain Score 11/03/23 1257 0     Pain Loc --      Pain Education --      Exclude  from Growth Chart --    No data found.  Updated Vital Signs BP (!) 140/71 (BP Location: Right Arm)   Pulse 82   Temp 98.1 F (36.7 C) (Oral)   Resp 18   SpO2 92%   SpO2 after neb: 98%  Visual Acuity Right Eye Distance:   Left Eye Distance:   Bilateral Distance:    Right Eye Near:   Left Eye Near:    Bilateral Near:     Physical Exam Vitals and nursing note reviewed.  Constitutional:      General: She is not in acute distress.    Appearance: Normal appearance. She is not ill-appearing or toxic-appearing.  HENT:     Head: Normocephalic and atraumatic.     Right Ear: Tympanic membrane, ear canal and external ear normal.     Left Ear: Tympanic membrane, ear canal and external ear normal.     Nose: Congestion present. No rhinorrhea.     Mouth/Throat:     Mouth: Mucous membranes are moist.     Pharynx: Oropharynx is clear. No oropharyngeal exudate or posterior oropharyngeal erythema.  Eyes:     General: No scleral icterus.    Extraocular Movements: Extraocular movements intact.  Cardiovascular:     Rate and Rhythm: Normal rate and regular rhythm.  Pulmonary:     Effort: Pulmonary effort is normal. No respiratory distress.     Breath sounds: Wheezing and rhonchi present. No rales.  Musculoskeletal:     Cervical back: Normal range of motion and neck supple.  Lymphadenopathy:     Cervical: No cervical adenopathy.  Skin:    General: Skin is warm and dry.     Coloration: Skin is not jaundiced or pale.     Findings: No erythema or rash.  Neurological:     Mental Status: She is alert and oriented to person, place, and time.  Psychiatric:        Behavior: Behavior is cooperative.      UC Treatments / Results  Labs (all labs ordered are listed, but only abnormal results are displayed) Labs Reviewed - No data to display  EKG   Radiology No results found.  Procedures Procedures (including critical care time)  Medications Ordered in UC Medications  albuterol  (PROVENTIL) (2.5 MG/3ML) 0.083% nebulizer solution 2.5 mg (2.5 mg Nebulization Given 11/03/23 1313)    Initial Impression / Assessment and Plan / UC Course  I have reviewed the triage vital signs and the nursing notes.  Pertinent labs & imaging results that were available during my care of the patient were reviewed by me and considered in my medical decision making (see chart for details).   Patient is well-appearing, normotensive, afebrile, not tachycardic, not tachypneic, oxygenating well on room air.  Albuterol nebulizer was given which improved SpO2 from 92% on room air to 98% on room air.  1. Viral URI with cough 2. Wheezing Overall, vitals and exam are reassuring Suspect initially viral illness that has now caused wheezing and likely bronchitis Wheezing was treated with albuterol in urgent care today, some wheezing remained, however SpO2 increased dramatically so we started albuterol inhaler at home and oral prednisone Chest x-ray obtained for rule out of pneumonia Other supportive care discussed with patient in the meantime Return and ER precautions discussed Work excuse provided  Update: Chest x-ray is negative for acute cardiopulmonary process.  I called patient and discussed results with her.  No change to treatment plan.  All questions answered to the best of my ability.  The patient was given the opportunity to ask questions.  All questions answered to their satisfaction.  The patient is in agreement to this plan.   Final Clinical Impressions(s) / UC Diagnoses   Final diagnoses:  Viral URI with cough  Wheezing     Discharge Instructions      I will contact you later today if the results of the chest x-ray are consistent with pneumonia.  If you do not hear from [place] later today, that means your x-ray is negative and we do not need to change the treatment plan.  In the meantime, we will start using the albuterol inhaler scheduled for the next 48 hours every 4-6 hours.  Also  recommend continuing allergy medicine, Flonase, and starting oral prednisone to help with lung inflammation.  You can take the cough liquid I sent to the pharmacy to help with the cough.  Also, guaifenesin 600 mg twice daily can be helpful to break up the mucus and help it to come out easier.  Seek care if symptoms do not improve with treatment or if symptoms worsen.     ED Prescriptions     Medication Sig Dispense Auth. Provider   albuterol (VENTOLIN HFA) 108 (90 Base) MCG/ACT inhaler Inhale 1-2 puffs into the lungs every 6 (six) hours as needed for wheezing or shortness of breath. 1 each Valentino Nose, NP   predniSONE (DELTASONE) 20 MG tablet Take 2 tablets (40 mg total) by mouth daily with breakfast for 5 days. 10 tablet Cathlean Marseilles A, NP   promethazine-dextromethorphan (PROMETHAZINE-DM) 6.25-15 MG/5ML syrup Take 5 mLs by mouth at bedtime as needed for cough. Do not take with alcohol or while driving or operating heavy machinery.  May cause drowsiness. 118 mL Valentino Nose, NP      PDMP not reviewed this encounter.   Valentino Nose, NP 11/03/23 [phone]

## 2023-11-03 NOTE — Discharge Instructions (Addendum)
 I will contact you later today if the results of the chest x-ray are consistent with pneumonia.  If you do not hear from [place] later today, that means your x-ray is negative and we do not need to change the treatment plan.  In the meantime, we will start using the albuterol inhaler scheduled for the next 48 hours every 4-6 hours.  Also recommend continuing allergy medicine, Flonase, and starting oral prednisone to help with lung inflammation.  You can take the cough liquid I sent to the pharmacy to help with the cough.  Also, guaifenesin 600 mg twice daily can be helpful to break up the mucus and help it to come out easier.  Seek care if symptoms do not improve with treatment or if symptoms worsen.

## 2023-11-03 NOTE — ED Triage Notes (Signed)
 Pt reports she has some congestion, coughing, runny nose, and watery eyes x 6 days    Took robitussin and zyzal

## 2024-02-19 DIAGNOSIS — M62830 Muscle spasm of back: Secondary | ICD-10-CM | POA: Diagnosis not present

## 2024-02-19 DIAGNOSIS — L231 Allergic contact dermatitis due to adhesives: Secondary | ICD-10-CM | POA: Diagnosis not present

## 2024-02-19 DIAGNOSIS — E663 Overweight: Secondary | ICD-10-CM | POA: Diagnosis not present

## 2024-02-19 DIAGNOSIS — Z6826 Body mass index (BMI) 26.0-26.9, adult: Secondary | ICD-10-CM | POA: Diagnosis not present

## 2024-02-19 DIAGNOSIS — I10 Essential (primary) hypertension: Secondary | ICD-10-CM | POA: Diagnosis not present

## 2024-03-25 DIAGNOSIS — M62830 Muscle spasm of back: Secondary | ICD-10-CM | POA: Diagnosis not present

## 2024-03-25 DIAGNOSIS — I10 Essential (primary) hypertension: Secondary | ICD-10-CM | POA: Diagnosis not present

## 2024-03-25 DIAGNOSIS — Z6825 Body mass index (BMI) 25.0-25.9, adult: Secondary | ICD-10-CM | POA: Diagnosis not present

## 2024-05-10 DIAGNOSIS — Z23 Encounter for immunization: Secondary | ICD-10-CM | POA: Diagnosis not present

## 2024-05-25 DIAGNOSIS — Z0000 Encounter for general adult medical examination without abnormal findings: Secondary | ICD-10-CM | POA: Diagnosis not present

## 2024-05-25 DIAGNOSIS — E785 Hyperlipidemia, unspecified: Secondary | ICD-10-CM | POA: Diagnosis not present

## 2024-05-25 DIAGNOSIS — I10 Essential (primary) hypertension: Secondary | ICD-10-CM | POA: Diagnosis not present

## 2024-05-25 DIAGNOSIS — K219 Gastro-esophageal reflux disease without esophagitis: Secondary | ICD-10-CM | POA: Diagnosis not present

## 2024-05-25 DIAGNOSIS — G43909 Migraine, unspecified, not intractable, without status migrainosus: Secondary | ICD-10-CM | POA: Diagnosis not present
